# Patient Record
Sex: Female | Born: 1945 | Race: White | Hispanic: No
Health system: Southern US, Community
[De-identification: ages and names within clinical notes are randomized; demographics above are authoritative.]

## PROBLEM LIST (undated history)

## (undated) DIAGNOSIS — J9621 Acute and chronic respiratory failure with hypoxia: Secondary | ICD-10-CM

## (undated) DIAGNOSIS — J181 Lobar pneumonia, unspecified organism: Secondary | ICD-10-CM

## (undated) DIAGNOSIS — G894 Chronic pain syndrome: Secondary | ICD-10-CM

## (undated) DIAGNOSIS — J189 Pneumonia, unspecified organism: Secondary | ICD-10-CM | POA: Insufficient documentation

## (undated) DIAGNOSIS — C434 Malignant melanoma of scalp and neck: Secondary | ICD-10-CM

## (undated) DIAGNOSIS — G9341 Metabolic encephalopathy: Secondary | ICD-10-CM

## (undated) DIAGNOSIS — J449 Chronic obstructive pulmonary disease, unspecified: Secondary | ICD-10-CM

## (undated) DIAGNOSIS — K219 Gastro-esophageal reflux disease without esophagitis: Secondary | ICD-10-CM | POA: Insufficient documentation

## (undated) DIAGNOSIS — E039 Hypothyroidism, unspecified: Secondary | ICD-10-CM | POA: Insufficient documentation

---

## 2014-08-28 DIAGNOSIS — H2512 Age-related nuclear cataract, left eye: Secondary | ICD-10-CM | POA: Insufficient documentation

## 2015-05-04 DIAGNOSIS — J441 Chronic obstructive pulmonary disease with (acute) exacerbation: Secondary | ICD-10-CM | POA: Insufficient documentation

## 2015-07-02 DIAGNOSIS — J209 Acute bronchitis, unspecified: Secondary | ICD-10-CM | POA: Insufficient documentation

## 2015-07-15 DIAGNOSIS — D72829 Elevated white blood cell count, unspecified: Secondary | ICD-10-CM | POA: Insufficient documentation

## 2016-01-08 DIAGNOSIS — Z85820 Personal history of malignant melanoma of skin: Secondary | ICD-10-CM | POA: Insufficient documentation

## 2016-03-07 DIAGNOSIS — Z8659 Personal history of other mental and behavioral disorders: Secondary | ICD-10-CM | POA: Insufficient documentation

## 2016-03-07 DIAGNOSIS — G40909 Epilepsy, unspecified, not intractable, without status epilepticus: Secondary | ICD-10-CM | POA: Insufficient documentation

## 2016-03-08 DIAGNOSIS — B888 Other specified infestations: Secondary | ICD-10-CM | POA: Insufficient documentation

## 2017-07-03 DIAGNOSIS — Q2112 Patent foramen ovale: Secondary | ICD-10-CM | POA: Insufficient documentation

## 2017-07-03 DIAGNOSIS — I10 Essential (primary) hypertension: Secondary | ICD-10-CM | POA: Insufficient documentation

## 2017-11-15 DIAGNOSIS — J9601 Acute respiratory failure with hypoxia: Secondary | ICD-10-CM | POA: Insufficient documentation

## 2019-02-27 DIAGNOSIS — E1165 Type 2 diabetes mellitus with hyperglycemia: Secondary | ICD-10-CM | POA: Insufficient documentation

## 2019-07-23 DIAGNOSIS — J988 Other specified respiratory disorders: Secondary | ICD-10-CM | POA: Insufficient documentation

## 2019-08-07 ENCOUNTER — Other Ambulatory Visit (HOSPITAL_COMMUNITY): Payer: No Typology Code available for payment source

## 2019-08-07 ENCOUNTER — Inpatient Hospital Stay (HOSPITAL_COMMUNITY)
Admission: RE | Admit: 2019-08-07 | Discharge: 2019-08-20 | Disposition: A | Discharge status: Still In Hospital | Payer: No Typology Code available for payment source | Source: Other Acute Inpatient Hospital

## 2019-08-07 DIAGNOSIS — G9341 Metabolic encephalopathy: Secondary | ICD-10-CM | POA: Diagnosis present

## 2019-08-07 DIAGNOSIS — Z431 Encounter for attention to gastrostomy: Secondary | ICD-10-CM

## 2019-08-07 DIAGNOSIS — Z0189 Encounter for other specified special examinations: Secondary | ICD-10-CM

## 2019-08-07 DIAGNOSIS — J449 Chronic obstructive pulmonary disease, unspecified: Secondary | ICD-10-CM | POA: Diagnosis present

## 2019-08-07 DIAGNOSIS — J181 Lobar pneumonia, unspecified organism: Secondary | ICD-10-CM | POA: Diagnosis present

## 2019-08-07 DIAGNOSIS — J9809 Other diseases of bronchus, not elsewhere classified: Secondary | ICD-10-CM

## 2019-08-07 DIAGNOSIS — G894 Chronic pain syndrome: Secondary | ICD-10-CM | POA: Diagnosis present

## 2019-08-07 DIAGNOSIS — T17500A Unspecified foreign body in bronchus causing asphyxiation, initial encounter: Secondary | ICD-10-CM

## 2019-08-07 DIAGNOSIS — J189 Pneumonia, unspecified organism: Secondary | ICD-10-CM

## 2019-08-07 DIAGNOSIS — C434 Malignant melanoma of scalp and neck: Secondary | ICD-10-CM | POA: Diagnosis present

## 2019-08-07 DIAGNOSIS — J9690 Respiratory failure, unspecified, unspecified whether with hypoxia or hypercapnia: Secondary | ICD-10-CM

## 2019-08-07 DIAGNOSIS — K567 Ileus, unspecified: Secondary | ICD-10-CM

## 2019-08-07 DIAGNOSIS — J9621 Acute and chronic respiratory failure with hypoxia: Secondary | ICD-10-CM | POA: Diagnosis present

## 2019-08-07 HISTORY — DX: Lobar pneumonia, unspecified organism: J18.1

## 2019-08-07 HISTORY — DX: Malignant melanoma of scalp and neck: C43.4

## 2019-08-07 HISTORY — DX: Acute and chronic respiratory failure with hypoxia: J96.21

## 2019-08-07 HISTORY — DX: Chronic obstructive pulmonary disease, unspecified: J44.9

## 2019-08-07 HISTORY — DX: Chronic pain syndrome: G89.4

## 2019-08-07 HISTORY — DX: Metabolic encephalopathy: G93.41

## 2019-08-08 ENCOUNTER — Other Ambulatory Visit (HOSPITAL_COMMUNITY): Payer: No Typology Code available for payment source

## 2019-08-08 ENCOUNTER — Encounter: Payer: Self-pay | Admitting: Internal Medicine

## 2019-08-08 DIAGNOSIS — J9621 Acute and chronic respiratory failure with hypoxia: Secondary | ICD-10-CM | POA: Diagnosis not present

## 2019-08-08 DIAGNOSIS — G894 Chronic pain syndrome: Secondary | ICD-10-CM | POA: Diagnosis present

## 2019-08-08 DIAGNOSIS — C434 Malignant melanoma of scalp and neck: Secondary | ICD-10-CM | POA: Diagnosis present

## 2019-08-08 DIAGNOSIS — G9341 Metabolic encephalopathy: Secondary | ICD-10-CM | POA: Diagnosis not present

## 2019-08-08 DIAGNOSIS — J449 Chronic obstructive pulmonary disease, unspecified: Secondary | ICD-10-CM | POA: Diagnosis not present

## 2019-08-08 DIAGNOSIS — J181 Lobar pneumonia, unspecified organism: Secondary | ICD-10-CM | POA: Diagnosis present

## 2019-08-08 LAB — COMPREHENSIVE METABOLIC PANEL
ALT: 13 U/L (ref 0–44)
AST: 14 U/L — ABNORMAL LOW (ref 15–41)
Albumin: 2.8 g/dL — ABNORMAL LOW (ref 3.5–5.0)
Alkaline Phosphatase: 49 U/L (ref 38–126)
Anion gap: 12 (ref 5–15)
BUN: 8 mg/dL (ref 8–23)
CO2: 26 mmol/L (ref 22–32)
Calcium: 8.5 mg/dL — ABNORMAL LOW (ref 8.9–10.3)
Chloride: 106 mmol/L (ref 98–111)
Creatinine, Ser: 0.69 mg/dL (ref 0.44–1.00)
GFR calc Af Amer: >60 mL/min (ref >60)
GFR calc non Af Amer: >60 mL/min (ref >60)
Glucose, Bld: 127 mg/dL — ABNORMAL HIGH (ref 70–99)
Potassium: 3.5 mmol/L (ref 3.5–5.1)
Sodium: 144 mmol/L (ref 135–145)
Total Bilirubin: 0.6 mg/dL (ref 0.3–1.2)
Total Protein: 6.1 g/dL — ABNORMAL LOW (ref 6.5–8.1)

## 2019-08-08 LAB — CBC WITH DIFFERENTIAL/PLATELET
Abs Immature Granulocytes: 0.05 10*3/uL (ref 0.00–0.07)
Basophils Absolute: 0 10*3/uL (ref 0.0–0.1)
Basophils Relative: 1 %
Eosinophils Absolute: 0.2 10*3/uL (ref 0.0–0.5)
Eosinophils Relative: 4 %
HCT: 33.4 % — ABNORMAL LOW (ref 36.0–46.0)
Hemoglobin: 10.1 g/dL — ABNORMAL LOW (ref 12.0–15.0)
Immature Granulocytes: 1 %
Lymphocytes Relative: 20 %
Lymphs Abs: 1.2 10*3/uL (ref 0.7–4.0)
MCH: 28.2 pg (ref 26.0–34.0)
MCHC: 30.2 g/dL (ref 30.0–36.0)
MCV: 93.3 fL (ref 80.0–100.0)
Monocytes Absolute: 0.4 10*3/uL (ref 0.1–1.0)
Monocytes Relative: 7 %
Neutro Abs: 4 10*3/uL (ref 1.7–7.7)
Neutrophils Relative %: 67 %
Platelets: 221 10*3/uL (ref 150–400)
RBC: 3.58 MIL/uL — ABNORMAL LOW (ref 3.87–5.11)
RDW: 17.9 % — ABNORMAL HIGH (ref 11.5–15.5)
WBC: 5.8 10*3/uL (ref 4.0–10.5)
nRBC: 0 % (ref 0.0–0.2)

## 2019-08-08 LAB — T4, FREE: Free T4: 1.02 ng/dL (ref 0.61–1.12)

## 2019-08-08 LAB — PHOSPHORUS: Phosphorus: 3.9 mg/dL (ref 2.5–4.6)

## 2019-08-08 LAB — TSH: TSH: 3.293 u[IU]/mL (ref 0.350–4.500)

## 2019-08-08 LAB — PROTIME-INR
INR: 0.9 (ref 0.8–1.2)
Prothrombin Time: 12.4 seconds (ref 11.4–15.2)

## 2019-08-08 LAB — MAGNESIUM: Magnesium: 2 mg/dL (ref 1.7–2.4)

## 2019-08-08 MED ORDER — CARBOXYMETHYLCELLULOSE SOD PF 0.5 % OP SOLN
2.00 | OPHTHALMIC | Status: DC
Start: 2019-08-07 — End: 2019-08-08

## 2019-08-08 MED ORDER — FAMOTIDINE 20 MG PO TABS
20.00 | ORAL_TABLET | ORAL | Status: DC
Start: 2019-08-08 — End: 2019-08-08

## 2019-08-08 MED ORDER — HEPARIN SODIUM (PORCINE) 5000 UNIT/ML IJ SOLN
7500.00 | INTRAMUSCULAR | Status: DC
Start: 2019-08-07 — End: 2019-08-08

## 2019-08-08 MED ORDER — FUROSEMIDE 10 MG/ML IJ SOLN
20.00 | INTRAMUSCULAR | Status: DC
Start: 2019-08-08 — End: 2019-08-08

## 2019-08-08 MED ORDER — BUSPIRONE HCL 15 MG PO TABS
30.00 | ORAL_TABLET | ORAL | Status: DC
Start: 2019-08-07 — End: 2019-08-08

## 2019-08-08 MED ORDER — IPRATROPIUM-ALBUTEROL 0.5-2.5 (3) MG/3ML IN SOLN
3.00 | RESPIRATORY_TRACT | Status: DC
Start: 2019-08-07 — End: 2019-08-08

## 2019-08-08 MED ORDER — INSULIN ASPART 100 UNIT/ML FLEXPEN
0.00 | PEN_INJECTOR | SUBCUTANEOUS | Status: DC
Start: 2019-08-07 — End: 2019-08-08

## 2019-08-08 MED ORDER — DULOXETINE HCL 60 MG PO CPEP
60.00 | ORAL_CAPSULE | ORAL | Status: DC
Start: 2019-08-08 — End: 2019-08-08

## 2019-08-08 MED ORDER — GENERIC EXTERNAL MEDICATION
12.00 | Status: DC
Start: ? — End: 2019-08-08

## 2019-08-08 MED ORDER — GENERIC EXTERNAL MEDICATION
Status: DC
Start: ? — End: 2019-08-08

## 2019-08-08 MED ORDER — ROPINIROLE HCL 2 MG PO TABS
10.00 | ORAL_TABLET | ORAL | Status: DC
Start: 2019-08-07 — End: 2019-08-08

## 2019-08-08 MED ORDER — ATROPINE SULFATE 0.5 MG/5ML IJ SOSY
0.50 | PREFILLED_SYRINGE | INTRAMUSCULAR | Status: DC
Start: ? — End: 2019-08-08

## 2019-08-08 MED ORDER — LORATADINE 10 MG PO TABS
10.00 | ORAL_TABLET | ORAL | Status: DC
Start: 2019-08-08 — End: 2019-08-08

## 2019-08-08 MED ORDER — EPINEPHRINE 1 MG/10ML IJ SOSY
1.00 | PREFILLED_SYRINGE | INTRAMUSCULAR | Status: DC
Start: ? — End: 2019-08-08

## 2019-08-08 MED ORDER — CHLORHEXIDINE GLUCONATE 0.12 % MT SOLN
15.00 | OROMUCOSAL | Status: DC
Start: 2019-08-07 — End: 2019-08-08

## 2019-08-08 MED ORDER — NALOXONE HCL 0.4 MG/ML IJ SOLN
0.04 | INTRAMUSCULAR | Status: DC
Start: ? — End: 2019-08-08

## 2019-08-08 MED ORDER — AMLODIPINE BESYLATE 5 MG PO TABS
5.00 | ORAL_TABLET | ORAL | Status: DC
Start: 2019-08-08 — End: 2019-08-08

## 2019-08-08 MED ORDER — LORAZEPAM 2 MG/ML IJ SOLN
2.00 | INTRAMUSCULAR | Status: DC
Start: ? — End: 2019-08-08

## 2019-08-08 MED ORDER — L-LYSINE EX OINT
TOPICAL_OINTMENT | CUTANEOUS | Status: DC
Start: ? — End: 2019-08-08

## 2019-08-08 MED ORDER — INSULIN GLARGINE 100 UNIT/ML SOLOSTAR PEN
30.00 | PEN_INJECTOR | SUBCUTANEOUS | Status: DC
Start: 2019-08-07 — End: 2019-08-08

## 2019-08-08 MED ORDER — BISACODYL 10 MG RE SUPP
10.00 | RECTAL | Status: DC
Start: ? — End: 2019-08-08

## 2019-08-08 MED ORDER — MAGNESIUM SULFATE 4 GM/100ML IV SOLN
4.00 | INTRAVENOUS | Status: DC
Start: ? — End: 2019-08-08

## 2019-08-08 MED ORDER — GENERIC EXTERNAL MEDICATION
5.00 | Status: DC
Start: ? — End: 2019-08-08

## 2019-08-08 MED ORDER — GENERIC EXTERNAL MEDICATION
1.00 | Status: DC
Start: ? — End: 2019-08-08

## 2019-08-08 MED ORDER — GENERIC EXTERNAL MEDICATION
200.00 | Status: DC
Start: 2019-08-08 — End: 2019-08-08

## 2019-08-08 MED ORDER — ONDANSETRON HCL 4 MG/2ML IJ SOLN
4.00 | INTRAMUSCULAR | Status: DC
Start: ? — End: 2019-08-08

## 2019-08-08 MED ORDER — BIAFINE EX EMUL
CUTANEOUS | Status: DC
Start: ? — End: 2019-08-08

## 2019-08-08 MED ORDER — SODIUM CHLORIDE 0.9 % IV SOLN
INTRAVENOUS | Status: DC
Start: ? — End: 2019-08-08

## 2019-08-08 MED ORDER — PROSOURCE TF PO LIQD
90.00 | ORAL | Status: DC
Start: 2019-08-07 — End: 2019-08-08

## 2019-08-08 MED ORDER — BACLOFEN 10 MG PO TABS
10.00 | ORAL_TABLET | ORAL | Status: DC
Start: ? — End: 2019-08-08

## 2019-08-08 MED ORDER — GABAPENTIN 300 MG PO CAPS
600.00 | ORAL_CAPSULE | ORAL | Status: DC
Start: 2019-08-07 — End: 2019-08-08

## 2019-08-08 MED ORDER — ACETAMINOPHEN 325 MG PO TABS
650.00 | ORAL_TABLET | ORAL | Status: DC
Start: ? — End: 2019-08-08

## 2019-08-08 MED ORDER — CLONAZEPAM 1 MG PO TABS
1.00 | ORAL_TABLET | ORAL | Status: DC
Start: 2019-08-07 — End: 2019-08-08

## 2019-08-08 MED ORDER — LACTATED RINGERS IV SOLN
500.00 | INTRAVENOUS | Status: DC
Start: ? — End: 2019-08-08

## 2019-08-08 MED ORDER — GENERIC EXTERNAL MEDICATION
4000.00 | Status: DC
Start: ? — End: 2019-08-08

## 2019-08-08 MED ORDER — CLOPIDOGREL BISULFATE 75 MG PO TABS
75.00 | ORAL_TABLET | ORAL | Status: DC
Start: 2019-08-08 — End: 2019-08-08

## 2019-08-08 MED ORDER — MELATONIN 3 MG PO TABS
3.00 | ORAL_TABLET | ORAL | Status: DC
Start: 2019-08-07 — End: 2019-08-08

## 2019-08-08 MED ORDER — DEXTROSE 50 % IV SOLN
25.00 | INTRAVENOUS | Status: DC
Start: ? — End: 2019-08-08

## 2019-08-08 MED ORDER — IPRATROPIUM-ALBUTEROL 0.5-2.5 (3) MG/3ML IN SOLN
3.00 | RESPIRATORY_TRACT | Status: DC
Start: ? — End: 2019-08-08

## 2019-08-08 MED ORDER — METOCLOPRAMIDE HCL 5 MG/ML IJ SOLN
10.00 | INTRAMUSCULAR | Status: DC
Start: 2019-08-07 — End: 2019-08-08

## 2019-08-08 MED ORDER — SENNOSIDES-DOCUSATE SODIUM 8.6-50 MG PO TABS
2.00 | ORAL_TABLET | ORAL | Status: DC
Start: 2019-08-07 — End: 2019-08-08

## 2019-08-08 NOTE — Consult Note (Signed)
Pulmonary Critical Care Medicine [place]  PULMONARY SERVICE  Date of Service: 08/08/2019  PULMONARY CRITICAL CARE Shakarra Dunkle  [id number]  DOB: [birth year]   DOA: 08/07/2019  Referring Physician: Merton Border, MD  HPI: Latoya Harris is a 73 y.o. female seen for follow up of Acute on Chronic Respiratory Failure.  The history is somewhat scant however patient has a history of malignant melanoma who presented with swelling of the neck.  Patient underwent a CT scan because of respiratory distress and patient was intubated.  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  Hospital course at the other facility apparently had an ENT consultation for tracheostomy which was done the following day.  The patient continued on full support on the ventilator requiring about 40% FiO2 with a PEEP of 8.  Sedation was eventually weaned off patient was attempted on weans however did not tolerate.  Was attempted on pressure support 18/8 40% and has the Dex going at that time.  Patient apparently has a lot of issues with agitation and anxiety.  She is transferred to our facility now for further management and weaning attempts.  Review of Systems:  ROS performed and is unremarkable other than noted above.   Past Medical History:  Diagnosis Date  . Arthritis of spine  . Asthma  . Bell palsy  . Benign essential hypertension  . Calculus of kidney  . Cancer (HCC)  Left shoulder  . Cellulitis of arm, left 09/19/1898  . Chronic obstructive lung disease ([place])  . Chronic pain syndrome  . Degeneration of thoracic intervertebral disc  . Degeneration of thoracolumbar intervertebral disc  . Enlarged heart  . Facial paralysis/Bells palsy  . Gastroesophageal reflux disease  . Hematemesis 09/19/2009  . Hematemesis  Onset: 2011- Neg EGD 2011  . Hematochezia 09/19/1898  . Hematochezia  . History of primary malignant neoplasm of skin 09/19/1898  Melanoma  . Hypocalcemia 09/19/1898   . Hypokalemia 09/19/1898  . Hypothyroidism  . Insomnia  . Lymphedema  . Migraine  . Mixed stress and urge urinary incontinence  . Pneumonia 09/19/1898  . Postmastectomy lymphedema syndrome  . PUD (peptic ulcer disease)  . Sleep stage dysfunction  . Spondylosis without myelopathy  . Spontaneous ecchymosis   Past Surgical History:  Procedure Laterality Date  . EGD 2005  gastric polyps  . HX APPENDECTOMY  . HX BACK SURGERY  X 3  . HX CATARACT REMOVAL  . 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 HX CHOLECYSTECTOMY  . HX HYSTERECTOMY  . HX KNEE REPLACMENT Bilateral 2010  . HX LYMPHADENECTOMY  to left arm  . HX NECK SURGERY  X 2  . HX SPINAL SURGERY 1999  . PR XCAPSL CTRC RMVL INSJ IO LENS PROSTH W/O ECP Left 08/28/2014  CATARACT INTRAOCULAR LENS EXTRACTION performed by Mallie Mussel, MD at [place]  . PR XCAPSL CTRC RMVL INSJ IO LENS PROSTH W/O ECP Right 09/04/2014  CATARACT INTRAOCULAR LENS EXTRACTION performed by Mallie Mussel, MD at [place] History  Problem Relation Name Age of Onset  . Cancer Sister  . Respiratory Disease Brother  . Respiratory Disease Sister  . Asthma Sister  . Unknown Brother  . Hypertension Mother  . Heart Disease Mother  . Unknown Maternal Grandmother  . Unknown Maternal Grandfather  . Unknown Paternal Grandmother  . Unknown Paternal Grandfather  . Healthy Son  . Diabetes Neg Hx  Social History   Socioeconomic History  . Marital status: Widowed  Spouse name: Not on file  . Number of children: 1  . Years of education: 6  . Highest education level: Not on file  Occupational History  . Occupation: Oncologist  Social Needs  . Financial resource strain: Not on file  . Food insecurity  Worry: Not on file  Inability: Not on file  . Transportation needs  Medical: Not on file  Non-medical: Not on file  Tobacco Use  . Smoking status: Never Smoker  . Smokeless tobacco: Current User   Types: Snuff  Substance and Sexual Activity  . Alcohol use: No  . Drug use: No    Medications: Reviewed on Rounds  Physical Exam:  Vitals: Temperature 98.0 pulse 71 respiratory 22 blood pressure 154/59 saturations 97%  Ventilator Settings mode ventilation assist control FiO2 28% tidal volume was 450 PEEP 5  . General: Comfortable at this time . Eyes: Grossly normal lids, irises & conjunctiva . ENT: grossly tongue is normal . Neck: no obvious mass . Cardiovascular: S1-S2 normal no gallop or rub was noted . Respiratory: Coarse breath sounds with a few rhonchi . Abdomen: Soft and nontender at this time . Skin: no rash seen on limited exam . Musculoskeletal: not rigid . Psychiatric:unable to assess . Neurologic: no seizure no involuntary movements         Labs on Admission:  Basic Metabolic Panel: Recent Labs  Lab 08/08/19 0708  NA 144  K 3.5  CL 106  CO2 26  GLUCOSE 127*  BUN 8  CREATININE 0.69  CALCIUM 8.5*  MG 2.0  PHOS 3.9    No results for input(s): PHART, PCO2ART, PO2ART, HCO3, O2SAT in the last 168 hours.  Liver Function Tests: Recent Labs  Lab 08/08/19 0708  AST 14*  ALT 13  ALKPHOS 49  BILITOT 0.6  PROT 6.1*  ALBUMIN 2.8*   No results for input(s): LIPASE, AMYLASE in the last 168 hours. No results for input(s): AMMONIA in the last 168 hours.  CBC: Recent Labs  Lab 08/08/19 0708  WBC 5.8  NEUTROABS 4.0  HGB 10.1*  HCT 33.4*  MCV 93.3  PLT 221    Cardiac Enzymes: No results for input(s): CKTOTAL, CKMB, CKMBINDEX, TROPONINI in the last 168 hours.  BNP (last 3 results) No results for input(s): BNP in the last 8760 hours.  ProBNP (last 3 results) No results for input(s): PROBNP in the last 8760 hours.   Radiological Exams on Admission: Dg Abd 1 View  Result Date: 08/07/2019 CLINICAL DATA:  Feeding tube placement EXAM: ABDOMEN - 1 VIEW COMPARISON:  None. FINDINGS: The tip of the feeding tube appears to be in the descending  duodenum based on curvature. No dilated bowel noted. IMPRESSION: 1. Feeding tube tip: Descending duodenum. Electronically Signed   By: Van Clines M.D.   On: 08/07/2019 18:36   Dg Chest Port 1 View  Result Date: 08/08/2019 CLINICAL DATA:  Hypoxia EXAM: PORTABLE CHEST 1 VIEW COMPARISON:  None. FINDINGS: Tracheostomy catheter tip is 8.0 cm above the carina. Feeding tube tip is below the diaphragm. No pneumothorax. There is a small left pleural effusion with mild left base atelectasis. The lungs elsewhere are clear. Heart is mildly enlarged with pulmonary vascularity normal. There is aortic atherosclerosis. No adenopathy. There are surgical clips in the left axillary region. IMPRESSION: Tube positions as described without evident pneumothorax. Small left pleural effusion with left base atelectasis. Lungs elsewhere clear. Heart mildly enlarged.  Aortic Atherosclerosis (ICD10-I70.0). Electronically Signed   By: Lowella Grip III M.D.   On: 08/08/2019 08:01    Assessment/Plan Active Problems:   Acute on chronic respiratory failure with hypoxia (HCC)   COPD, severe (HCC)   Lobar pneumonia, unspecified organism (HCC)   Acute metabolic encephalopathy   Malignant melanoma of head and neck (HCC)   Chronic pain syndrome   1. Acute on chronic respiratory failure with hypoxia patient currently is on full support on assist control mode requiring 28% FiO2.  The patient had a tracheostomy done because of the neck mass so I do not anticipate that she will be decannulated however we will see how things go. 2. COPD patient apparently has history of COPD we will continue with the treatment per primary care team as needed nebulizers. 3. Lower lobe pneumonia is the patient was diagnosed at the other facility with a pneumonia.  Current chest x-ray reveals small pleural effusions with some basilar atelectasis we will continue to monitor closely. 4. Metabolic encephalopathy supportive care patient did have  issues with agitation at the other facility. 5. Chronic pain control as necessary 6. Malignant melanoma status post tracheostomy  I have personally seen and evaluated the patient, evaluated laboratory and imaging results, formulated the assessment and plan and placed orders. The Patient requires high complexity decision making for assessment and support.  Case was discussed on Rounds with the Respiratory Therapy Staff Time Spent 51minutes  Allyne Gee, MD [facility] Pulmonary Critical Care Medicine Sleep Medicine

## 2019-08-09 DIAGNOSIS — J9621 Acute and chronic respiratory failure with hypoxia: Secondary | ICD-10-CM | POA: Diagnosis not present

## 2019-08-09 DIAGNOSIS — J449 Chronic obstructive pulmonary disease, unspecified: Secondary | ICD-10-CM | POA: Diagnosis not present

## 2019-08-09 DIAGNOSIS — G894 Chronic pain syndrome: Secondary | ICD-10-CM | POA: Diagnosis not present

## 2019-08-09 DIAGNOSIS — J181 Lobar pneumonia, unspecified organism: Secondary | ICD-10-CM

## 2019-08-09 DIAGNOSIS — G9341 Metabolic encephalopathy: Secondary | ICD-10-CM | POA: Diagnosis not present

## 2019-08-09 NOTE — Progress Notes (Addendum)
Pulmonary Critical Care Medicine [place]   PULMONARY CRITICAL CARE SERVICE  PROGRESS NOTE  Date of Service: 08/09/2019  Latoya Harris  [id number]  DOB: September 22, 1945   DOA: 08/07/2019  Referring Physician: Merton Border, MD  HPI: Latoya Harris is a 73 y.o. female seen for follow up of Acute on Chronic Respiratory Failure.  Patient has a 12-hour goal today on pressure support currently satting well with fever distress.  Medications: Reviewed on Rounds  Physical Exam:  Vitals: Pulse 62 respirations 22 BP 147/70 O2 sat 98% temp 96.9  Ventilator Settings pressure support 12/5 FiO2 28%  . General: Comfortable at this time . Eyes: Grossly normal lids, irises & conjunctiva . ENT: grossly tongue is normal . Neck: no obvious mass . Cardiovascular: S1 S2 normal no gallop . Respiratory: No rales rhonchi noted . Abdomen: soft . Skin: no rash seen on limited exam . Musculoskeletal: not rigid . Psychiatric:unable to assess . Neurologic: no seizure no involuntary movements         Lab Data:   Basic Metabolic Panel: Recent Labs  Lab 08/08/19 0708  NA 144  K 3.5  CL 106  CO2 26  GLUCOSE 127*  BUN 8  CREATININE 0.69  CALCIUM 8.5*  MG 2.0  PHOS 3.9    ABG: No results for input(s): PHART, PCO2ART, PO2ART, HCO3, O2SAT in the last 168 hours.  Liver Function Tests: Recent Labs  Lab 08/08/19 0708  AST 14*  ALT 13  ALKPHOS 49  BILITOT 0.6  PROT 6.1*  ALBUMIN 2.8*   No results for input(s): LIPASE, AMYLASE in the last 168 hours. No results for input(s): AMMONIA in the last 168 hours.  CBC: Recent Labs  Lab 08/08/19 0708  WBC 5.8  NEUTROABS 4.0  HGB 10.1*  HCT 33.4*  MCV 93.3  PLT 221    Cardiac Enzymes: No results for input(s): CKTOTAL, CKMB, CKMBINDEX, TROPONINI in the last 168 hours.  BNP (last 3 results) No results for input(s): BNP in the last 8760 hours.  ProBNP (last 3 results) No results for input(s): PROBNP in the last  8760 hours.  Radiological Exams: Dg Abd 1 View  Result Date: 08/07/2019 CLINICAL DATA:  Feeding tube placement EXAM: ABDOMEN - 1 VIEW COMPARISON:  None. FINDINGS: The tip of the feeding tube appears to be in the descending duodenum based on curvature. No dilated bowel noted. IMPRESSION: 1. Feeding tube tip: Descending duodenum. Electronically Signed   By: Van Clines M.D.   On: 08/07/2019 18:36   Dg Chest Port 1 View  Result Date: 08/08/2019 CLINICAL DATA:  Hypoxia EXAM: PORTABLE CHEST 1 VIEW COMPARISON:  None. FINDINGS: Tracheostomy catheter tip is 8.0 cm above the carina. Feeding tube tip is below the diaphragm. No pneumothorax. There is a small left pleural effusion with mild left base atelectasis. The lungs elsewhere are clear. Heart is mildly enlarged with pulmonary vascularity normal. There is aortic atherosclerosis. No adenopathy. There are surgical clips in the left axillary region. IMPRESSION: Tube positions as described without evident pneumothorax. Small left pleural effusion with left base atelectasis. Lungs elsewhere clear. Heart mildly enlarged. Aortic Atherosclerosis (ICD10-I70.0). Electronically Signed   By: Lowella Grip III M.D.   On: 08/08/2019 08:01    Assessment/Plan Active Problems:   Acute on chronic respiratory failure with hypoxia (HCC)   COPD, severe (HCC)   Lobar pneumonia, unspecified organism (HCC)   Acute metabolic encephalopathy   Malignant melanoma of head and neck (HCC)   Chronic pain syndrome  1. Acute on chronic respiratory failure with hypoxia patient will continue to wean on pressure support as per protocol.  Has all 12 hours today.  We will continue supportive measures and pulmonary toilet. 2. COPD patient apparently has history of COPD patient currently being treated by primary care team continue nebulizers as needed. 3. Lower lobe pneumonia is the patient was diagnosed at the other facility continue to monitor. 4. Metabolic encephalopathy  we will continue monitor closely. 5. Chronic pain control as necessary 6. Malignant melanoma status post tracheostomy   I have personally seen and evaluated the patient, evaluated laboratory and imaging results, formulated the assessment and plan and placed orders. The Patient requires high complexity decision making for assessment and support.  Case was discussed on Rounds with the Respiratory Therapy Staff  Allyne Gee, MD [facility] Pulmonary Critical Care Medicine Sleep Medicine

## 2019-08-10 ENCOUNTER — Other Ambulatory Visit (HOSPITAL_COMMUNITY): Payer: No Typology Code available for payment source

## 2019-08-10 DIAGNOSIS — G894 Chronic pain syndrome: Secondary | ICD-10-CM

## 2019-08-10 DIAGNOSIS — J449 Chronic obstructive pulmonary disease, unspecified: Secondary | ICD-10-CM | POA: Diagnosis not present

## 2019-08-10 DIAGNOSIS — J181 Lobar pneumonia, unspecified organism: Secondary | ICD-10-CM

## 2019-08-10 DIAGNOSIS — G9341 Metabolic encephalopathy: Secondary | ICD-10-CM

## 2019-08-10 DIAGNOSIS — J9621 Acute and chronic respiratory failure with hypoxia: Secondary | ICD-10-CM

## 2019-08-10 DIAGNOSIS — C434 Malignant melanoma of scalp and neck: Secondary | ICD-10-CM

## 2019-08-10 LAB — BASIC METABOLIC PANEL
Anion gap: 11 (ref 5–15)
BUN: 12 mg/dL (ref 8–23)
CO2: 29 mmol/L (ref 22–32)
Calcium: 8.9 mg/dL (ref 8.9–10.3)
Chloride: 101 mmol/L (ref 98–111)
Creatinine, Ser: 0.86 mg/dL (ref 0.44–1.00)
GFR calc Af Amer: >60 mL/min (ref >60)
GFR calc non Af Amer: >60 mL/min (ref >60)
Glucose, Bld: 144 mg/dL — ABNORMAL HIGH (ref 70–99)
Potassium: 3.5 mmol/L (ref 3.5–5.1)
Sodium: 141 mmol/L (ref 135–145)

## 2019-08-10 LAB — MAGNESIUM: Magnesium: 2.2 mg/dL (ref 1.7–2.4)

## 2019-08-10 MED ORDER — GENERIC EXTERNAL MEDICATION
Status: DC
Start: ? — End: 2019-08-10

## 2019-08-10 NOTE — Progress Notes (Addendum)
Pulmonary Critical Care Medicine [place]   PULMONARY CRITICAL CARE SERVICE  PROGRESS NOTE  Date of Service: 08/10/2019  Latoya Harris  [id number]  DOB: 1946/09/12   DOA: 08/07/2019  Referring Physician: Merton Border, MD  HPI: Latoya Harris is a 73 y.o. female seen for follow up of Acute on Chronic Respiratory Failure.  Patient remains on pressure at this time for 16-hour goal today currently satting well no distress.  Medications: Reviewed on Rounds  Physical Exam:  Vitals: Pulse 70 respirations 25 BP 120/32 O2 sat 98% temp 97.0  Ventilator Settings pressure support 12/5 FiO2 20%  . General: Comfortable at this time . Eyes: Grossly normal lids, irises & conjunctiva . ENT: grossly tongue is normal . Neck: no obvious mass . Cardiovascular: S1 S2 normal no gallop . Respiratory: No rales rhonchi noted . Abdomen: soft . Skin: no rash seen on limited exam . Musculoskeletal: not rigid . Psychiatric:unable to assess . Neurologic: no seizure no involuntary movements         Lab Data:   Basic Metabolic Panel: Recent Labs  Lab 08/08/19 0708 08/10/19 0539  NA 144 141  K 3.5 3.5  CL 106 101  CO2 26 29  GLUCOSE 127* 144*  BUN 8 12  CREATININE 0.69 0.86  CALCIUM 8.5* 8.9  MG 2.0 2.2  PHOS 3.9  --     ABG: No results for input(s): PHART, PCO2ART, PO2ART, HCO3, O2SAT in the last 168 hours.  Liver Function Tests: Recent Labs  Lab 08/08/19 0708  AST 14*  ALT 13  ALKPHOS 49  BILITOT 0.6  PROT 6.1*  ALBUMIN 2.8*   No results for input(s): LIPASE, AMYLASE in the last 168 hours. No results for input(s): AMMONIA in the last 168 hours.  CBC: Recent Labs  Lab 08/08/19 0708  WBC 5.8  NEUTROABS 4.0  HGB 10.1*  HCT 33.4*  MCV 93.3  PLT 221    Cardiac Enzymes: No results for input(s): CKTOTAL, CKMB, CKMBINDEX, TROPONINI in the last 168 hours.  BNP (last 3 results) No results for input(s): BNP in the last 8760 hours.  ProBNP  (last 3 results) No results for input(s): PROBNP in the last 8760 hours.  Radiological Exams: Dg Abd 1 View  Result Date: 08/10/2019 CLINICAL DATA:  Enteric tube placement EXAM: ABDOMEN - 1 VIEW COMPARISON:  08/07/2019 abdominal radiograph FINDINGS: Enteric tube terminates in the transverse duodenum. No disproportionately dilated small bowel loops. Moderate colonic gas. No evidence of pneumatosis or pneumoperitoneum. Surgical clips are seen in the right upper quadrant of the abdomen. No radiopaque nephrolithiasis. IMPRESSION: 1. Enteric tube terminates in post pyloric position with tip in the transverse duodenum. 2. Nonobstructive bowel gas pattern. 3. Moderate colonic gas. Electronically Signed   By: Ilona Sorrel M.D.   On: 08/10/2019 13:04    Assessment/Plan Active Problems:   Acute on chronic respiratory failure with hypoxia (HCC)   COPD, severe (HCC)   Lobar pneumonia, unspecified organism (HCC)   Acute metabolic encephalopathy   Malignant melanoma of head and neck (HCC)   Chronic pain syndrome   1. Acute on chronic respiratory failure with hypoxia  patient: 16 hours on first today currently satting well we will continue to wean per protocol as patient tolerate.  Continue aggressive pulmonary toilet supportive measures. 2. COPD patient apparently has history of COPD patient currently being treated by primary care team continue nebulizers as needed. 3. Lower lobe pneumonia is the patient was diagnosed at the other facility continue  to monitor. 4. Metabolic encephalopathy we will continue monitor closely. 5. Chronic pain control as necessary 6. Malignant melanoma status post tracheostomy   I have personally seen and evaluated the patient, evaluated laboratory and imaging results, formulated the assessment and plan and placed orders. The Patient requires high complexity decision making for assessment and support.  Case was discussed on Rounds with the Respiratory Therapy  Staff  Allyne Gee, MD [facility] Pulmonary Critical Care Medicine Sleep Medicine

## 2019-08-11 DIAGNOSIS — J9621 Acute and chronic respiratory failure with hypoxia: Secondary | ICD-10-CM | POA: Diagnosis not present

## 2019-08-11 DIAGNOSIS — G9341 Metabolic encephalopathy: Secondary | ICD-10-CM | POA: Diagnosis not present

## 2019-08-11 DIAGNOSIS — G894 Chronic pain syndrome: Secondary | ICD-10-CM | POA: Diagnosis not present

## 2019-08-11 DIAGNOSIS — J449 Chronic obstructive pulmonary disease, unspecified: Secondary | ICD-10-CM | POA: Diagnosis not present

## 2019-08-11 LAB — BASIC METABOLIC PANEL
Anion gap: 12 (ref 5–15)
BUN: 13 mg/dL (ref 8–23)
CO2: 28 mmol/L (ref 22–32)
Calcium: 9.1 mg/dL (ref 8.9–10.3)
Chloride: 102 mmol/L (ref 98–111)
Creatinine, Ser: 0.93 mg/dL (ref 0.44–1.00)
GFR calc Af Amer: >60 mL/min (ref >60)
GFR calc non Af Amer: >60 mL/min (ref >60)
Glucose, Bld: 149 mg/dL — ABNORMAL HIGH (ref 70–99)
Potassium: 4.5 mmol/L (ref 3.5–5.1)
Sodium: 142 mmol/L (ref 135–145)

## 2019-08-11 NOTE — Progress Notes (Signed)
Pulmonary Critical Care Medicine [place]   PULMONARY CRITICAL CARE SERVICE  PROGRESS NOTE  Date of Service: 08/11/2019  Latoya Harris  [id number]  DOB: [birth year]   DOA: 08/07/2019  Referring Physician: Merton Border, MD  HPI: Latoya Harris is a 73 y.o. female seen for follow up of Acute on Chronic Respiratory Failure.  Patient is on T collar weaning goal is for about 2 hours  Medications: Reviewed on Rounds  Physical Exam:  Vitals: Temperature 97.5 pulse 74 respiratory 27 blood pressure 133/79 saturation 98%  Ventilator Settings off the ventilator on T collar currently on 28% FiO2  . General: Comfortable at this time . Eyes: Grossly normal lids, irises & conjunctiva . ENT: grossly tongue is normal . Neck: no obvious mass . Cardiovascular: S1 S2 normal no gallop . Respiratory: No rhonchi no rales are noted at this time. . Abdomen: soft . Skin: no rash seen on limited exam . Musculoskeletal: not rigid . Psychiatric:unable to assess . Neurologic: no seizure no involuntary movements         Lab Data:   Basic Metabolic Panel: Recent Labs  Lab 08/08/19 0708 08/10/19 0539  NA 144 141  K 3.5 3.5  CL 106 101  CO2 26 29  GLUCOSE 127* 144*  BUN 8 12  CREATININE 0.69 0.86  CALCIUM 8.5* 8.9  MG 2.0 2.2  PHOS 3.9  --     ABG: No results for input(s): PHART, PCO2ART, PO2ART, HCO3, O2SAT in the last 168 hours.  Liver Function Tests: Recent Labs  Lab 08/08/19 0708  AST 14*  ALT 13  ALKPHOS 49  BILITOT 0.6  PROT 6.1*  ALBUMIN 2.8*   No results for input(s): LIPASE, AMYLASE in the last 168 hours. No results for input(s): AMMONIA in the last 168 hours.  CBC: Recent Labs  Lab 08/08/19 0708  WBC 5.8  NEUTROABS 4.0  HGB 10.1*  HCT 33.4*  MCV 93.3  PLT 221    Cardiac Enzymes: No results for input(s): CKTOTAL, CKMB, CKMBINDEX, TROPONINI in the last 168 hours.  BNP (last 3 results) No results for input(s): BNP in the last  8760 hours.  ProBNP (last 3 results) No results for input(s): PROBNP in the last 8760 hours.  Radiological Exams: Dg Abd 1 View  Result Date: 08/10/2019 CLINICAL DATA:  Enteric tube placement EXAM: ABDOMEN - 1 VIEW COMPARISON:  08/07/2019 abdominal radiograph FINDINGS: Enteric tube terminates in the transverse duodenum. No disproportionately dilated small bowel loops. Moderate colonic gas. No evidence of pneumatosis or pneumoperitoneum. Surgical clips are seen in the right upper quadrant of the abdomen. No radiopaque nephrolithiasis. IMPRESSION: 1. Enteric tube terminates in post pyloric position with tip in the transverse duodenum. 2. Nonobstructive bowel gas pattern. 3. Moderate colonic gas. Electronically Signed   By: Ilona Sorrel M.D.   On: 08/10/2019 13:04    Assessment/Plan Active Problems:   Acute on chronic respiratory failure with hypoxia (HCC)   COPD, severe (HCC)   Lobar pneumonia, unspecified organism (HCC)   Acute metabolic encephalopathy   Malignant melanoma of head and neck (HCC)   Chronic pain syndrome   1. Acute on chronic respiratory failure hypoxia plan continue with T collar trials the goal is for 2 hours today. 2. Severe COPD at baseline continue present management 3. Lobar pneumonia treated 4. Metabolic encephalopathy no changes are noted 5. Malignant melanoma treated 6. Chronic pain syndrome we will continue with supportive care   I have personally seen and evaluated the patient,  evaluated laboratory and imaging results, formulated the assessment and plan and placed orders. The Patient requires high complexity decision making for assessment and support.  Case was discussed on Rounds with the Respiratory Therapy Staff  Allyne Gee, MD [facility] Pulmonary Critical Care Medicine Sleep Medicine

## 2019-08-12 DIAGNOSIS — J9621 Acute and chronic respiratory failure with hypoxia: Secondary | ICD-10-CM | POA: Diagnosis not present

## 2019-08-12 DIAGNOSIS — G9341 Metabolic encephalopathy: Secondary | ICD-10-CM | POA: Diagnosis not present

## 2019-08-12 DIAGNOSIS — G894 Chronic pain syndrome: Secondary | ICD-10-CM | POA: Diagnosis not present

## 2019-08-12 DIAGNOSIS — J449 Chronic obstructive pulmonary disease, unspecified: Secondary | ICD-10-CM | POA: Diagnosis not present

## 2019-08-12 LAB — CBC
HCT: 38.4 % (ref 36.0–46.0)
Hemoglobin: 12.1 g/dL (ref 12.0–15.0)
MCH: 28.6 pg (ref 26.0–34.0)
MCHC: 31.5 g/dL (ref 30.0–36.0)
MCV: 90.8 fL (ref 80.0–100.0)
Platelets: 267 10*3/uL (ref 150–400)
RBC: 4.23 MIL/uL (ref 3.87–5.11)
RDW: 17.1 % — ABNORMAL HIGH (ref 11.5–15.5)
WBC: 7.2 10*3/uL (ref 4.0–10.5)
nRBC: 0 % (ref 0.0–0.2)

## 2019-08-12 LAB — BASIC METABOLIC PANEL
Anion gap: 12 (ref 5–15)
BUN: 13 mg/dL (ref 8–23)
CO2: 29 mmol/L (ref 22–32)
Calcium: 9.1 mg/dL (ref 8.9–10.3)
Chloride: 101 mmol/L (ref 98–111)
Creatinine, Ser: 0.91 mg/dL (ref 0.44–1.00)
GFR calc Af Amer: >60 mL/min (ref >60)
GFR calc non Af Amer: >60 mL/min (ref >60)
Glucose, Bld: 116 mg/dL — ABNORMAL HIGH (ref 70–99)
Potassium: 4 mmol/L (ref 3.5–5.1)
Sodium: 142 mmol/L (ref 135–145)

## 2019-08-12 LAB — MAGNESIUM: Magnesium: 2.5 mg/dL — ABNORMAL HIGH (ref 1.7–2.4)

## 2019-08-12 NOTE — Progress Notes (Signed)
Pulmonary Critical Care Medicine [place]   PULMONARY CRITICAL CARE SERVICE  PROGRESS NOTE  Date of Service: 08/12/2019  Latoya Harris  [id number]  DOB: 11-Jan-1946   DOA: 08/07/2019  Referring Physician: Merton Border, MD  HPI: Latoya Harris is a 73 y.o. female seen for follow up of Acute on Chronic Respiratory Failure.  Patient right now is on T collar has been on 28% FiO2 with good saturations  Medications: Reviewed on Rounds  Physical Exam:  Vitals: Temperature 97.7 pulse 59 respiratory 15 blood pressure 123/65 saturations 100%  Ventilator Settings off the ventilator right now on T collar with an FiO2 of 28%  . General: Comfortable at this time . Eyes: Grossly normal lids, irises & conjunctiva . ENT: grossly tongue is normal . Neck: no obvious mass . Cardiovascular: S1 S2 normal no gallop . Respiratory: No rhonchi no rales are noted at this time . Abdomen: soft . Skin: no rash seen on limited exam . Musculoskeletal: not rigid . Psychiatric:unable to assess . Neurologic: no seizure no involuntary movements         Lab Data:   Basic Metabolic Panel: Recent Labs  Lab 08/08/19 0708 08/10/19 0539 08/11/19 1413 08/12/19 0640  NA 144 141 142 142  K 3.5 3.5 4.5 4.0  CL 106 101 102 101  CO2 26 29 28 29   GLUCOSE 127* 144* 149* 116*  BUN 8 12 13 13   CREATININE 0.69 0.86 0.93 0.91  CALCIUM 8.5* 8.9 9.1 9.1  MG 2.0 2.2  --  2.5*  PHOS 3.9  --   --   --     ABG: No results for input(s): PHART, PCO2ART, PO2ART, HCO3, O2SAT in the last 168 hours.  Liver Function Tests: Recent Labs  Lab 08/08/19 0708  AST 14*  ALT 13  ALKPHOS 49  BILITOT 0.6  PROT 6.1*  ALBUMIN 2.8*   No results for input(s): LIPASE, AMYLASE in the last 168 hours. No results for input(s): AMMONIA in the last 168 hours.  CBC: Recent Labs  Lab 08/08/19 0708 08/12/19 0931  WBC 5.8 7.2  NEUTROABS 4.0  --   HGB 10.1* 12.1  HCT 33.4* 38.4  MCV 93.3 90.8  PLT  221 267    Cardiac Enzymes: No results for input(s): CKTOTAL, CKMB, CKMBINDEX, TROPONINI in the last 168 hours.  BNP (last 3 results) No results for input(s): BNP in the last 8760 hours.  ProBNP (last 3 results) No results for input(s): PROBNP in the last 8760 hours.  Radiological Exams: No results found.  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 Acute on chronic respiratory failure hypoxia plan continue with T collar trials the goal is for 8 hours 2. Severe COPD at baseline continue with present management 3. Lobar pneumonia treated clinically improved 4. Acute metabolic encephalopathy unchanged 5. Malignant melanoma treated we will continue to follow 6. Chronic pain controlled   I have personally seen and evaluated the patient, evaluated laboratory and imaging results, formulated the assessment and plan and placed orders. The Patient requires high complexity decision making for assessment and support.  Case was discussed on Rounds with the Respiratory Therapy Staff  Allyne Gee, MD [facility] Pulmonary Critical Care Medicine Sleep Medicine

## 2019-08-13 DIAGNOSIS — G9341 Metabolic encephalopathy: Secondary | ICD-10-CM | POA: Diagnosis not present

## 2019-08-13 DIAGNOSIS — J449 Chronic obstructive pulmonary disease, unspecified: Secondary | ICD-10-CM | POA: Diagnosis not present

## 2019-08-13 DIAGNOSIS — G894 Chronic pain syndrome: Secondary | ICD-10-CM | POA: Diagnosis not present

## 2019-08-13 DIAGNOSIS — J9621 Acute and chronic respiratory failure with hypoxia: Secondary | ICD-10-CM | POA: Diagnosis not present

## 2019-08-13 NOTE — Progress Notes (Signed)
Pulmonary Critical Care Medicine [place]   PULMONARY CRITICAL CARE SERVICE  PROGRESS NOTE  Date of Service: 08/13/2019  Latoya Harris  [id number]  DOB: 1946/06/05   DOA: 08/07/2019  Referring Physician: Merton Border, MD  HPI: Latoya Harris is a 73 y.o. female seen for follow up of Acute on Chronic Respiratory Failure.  Patient currently is on T collar 28% FiO2 with the PMV in place has been off the ventilator for more than 24 hours  Medications: Reviewed on Rounds  Physical Exam:  Vitals: Temperature 97.4 pulse 66 respiratory rate 30 blood pressure 121/64 saturations 100%  Ventilator Settings off the ventilator currently on T collar with an FiO2 of 28%  . General: Comfortable at this time . Eyes: Grossly normal lids, irises & conjunctiva . ENT: grossly tongue is normal . Neck: no obvious mass . Cardiovascular: S1 S2 normal no gallop . Respiratory: No rhonchi coarse breath sounds are noted . Abdomen: soft . Skin: no rash seen on limited exam . Musculoskeletal: not rigid . Psychiatric:unable to assess . Neurologic: no seizure no involuntary movements         Lab Data:   Basic Metabolic Panel: Recent Labs  Lab 08/08/19 0708 08/10/19 0539 08/11/19 1413 08/12/19 0640  NA 144 141 142 142  K 3.5 3.5 4.5 4.0  CL 106 101 102 101  CO2 26 29 28 29   GLUCOSE 127* 144* 149* 116*  BUN 8 12 13 13   CREATININE 0.69 0.86 0.93 0.91  CALCIUM 8.5* 8.9 9.1 9.1  MG 2.0 2.2  --  2.5*  PHOS 3.9  --   --   --     ABG: No results for input(s): PHART, PCO2ART, PO2ART, HCO3, O2SAT in the last 168 hours.  Liver Function Tests: Recent Labs  Lab 08/08/19 0708  AST 14*  ALT 13  ALKPHOS 49  BILITOT 0.6  PROT 6.1*  ALBUMIN 2.8*   No results for input(s): LIPASE, AMYLASE in the last 168 hours. No results for input(s): AMMONIA in the last 168 hours.  CBC: Recent Labs  Lab 08/08/19 0708 08/12/19 0931  WBC 5.8 7.2  NEUTROABS 4.0  --   HGB 10.1*  12.1  HCT 33.4* 38.4  MCV 93.3 90.8  PLT 221 267    Cardiac Enzymes: No results for input(s): CKTOTAL, CKMB, CKMBINDEX, TROPONINI in the last 168 hours.  BNP (last 3 results) No results for input(s): BNP in the last 8760 hours.  ProBNP (last 3 results) No results for input(s): PROBNP in the last 8760 hours.  Radiological Exams: No results found.  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 Acute on chronic respiratory failure with hypoxia plan is to continue with T collar trials patient currently is on 28% has been off the vent for greater than 24 hours 2. Severe COPD at baseline continue with supportive care 3. Lobar pneumonia treated 4. Acute metabolic encephalopathy unchanged 5. Malignant melanoma status post resection 6. Chronic pain controlled continue to follow-up   I have personally seen and evaluated the patient, evaluated laboratory and imaging results, formulated the assessment and plan and placed orders. The Patient requires high complexity decision making for assessment and support.  Case was discussed on Rounds with the Respiratory Therapy Staff  Saadat A  Humphrey Rolls, MD [facility] Pulmonary Critical Care Medicine Sleep Medicine

## 2019-08-14 ENCOUNTER — Other Ambulatory Visit (HOSPITAL_COMMUNITY): Payer: No Typology Code available for payment source

## 2019-08-14 ENCOUNTER — Institutional Professional Consult (permissible substitution) (HOSPITAL_COMMUNITY): Payer: No Typology Code available for payment source

## 2019-08-14 DIAGNOSIS — J449 Chronic obstructive pulmonary disease, unspecified: Secondary | ICD-10-CM | POA: Diagnosis not present

## 2019-08-14 DIAGNOSIS — G894 Chronic pain syndrome: Secondary | ICD-10-CM | POA: Diagnosis not present

## 2019-08-14 DIAGNOSIS — G9341 Metabolic encephalopathy: Secondary | ICD-10-CM | POA: Diagnosis not present

## 2019-08-14 DIAGNOSIS — J9621 Acute and chronic respiratory failure with hypoxia: Secondary | ICD-10-CM | POA: Diagnosis not present

## 2019-08-14 LAB — BLOOD GAS, ARTERIAL
Acid-Base Excess: 5.8 mmol/L — ABNORMAL HIGH (ref 0.0–2.0)
Bicarbonate: 31.3 mmol/L — ABNORMAL HIGH (ref 20.0–28.0)
FIO2: 90
O2 Saturation: 97.4 %
Patient temperature: 37
pCO2 arterial: 59.1 mmHg — ABNORMAL HIGH (ref 32.0–48.0)
pH, Arterial: 7.344 — ABNORMAL LOW (ref 7.350–7.450)
pO2, Arterial: 107 mmHg (ref 83.0–108.0)

## 2019-08-14 NOTE — Progress Notes (Signed)
Pulmonary Critical Care Medicine [place]   PULMONARY CRITICAL CARE SERVICE  PROGRESS NOTE  Date of Service: 08/14/2019  Niti Kohtz  [id number]  DOB: [birth year]   DOA: 08/07/2019  Referring Physician: Merton Border, MD  HPI: Latoya Harris is a 73 y.o. female seen for follow up of Acute on Chronic Respiratory Failure.  Patient currently is on T collar has been on 28% for 2 days secretions are still an issue.  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 General: Comfortable at this time . Eyes: Grossly normal lids, irises & conjunctiva . ENT: grossly tongue is normal . Neck: no obvious mass . Cardiovascular: S1 S2 normal no gallop . Respiratory: No rhonchi coarse breath sounds are noted . Abdomen: soft . Skin: no rash seen on limited exam . Musculoskeletal: not rigid . Psychiatric:unable to assess . Neurologic: no seizure no involuntary movements         Lab Data:   Basic Metabolic Panel: Recent Labs  Lab 08/08/19 0708 08/10/19 0539 08/11/19 1413 08/12/19 0640  NA 144 141 142 142  K 3.5 3.5 4.5 4.0  CL 106 101 102 101  CO2 26 29 28 29   GLUCOSE 127* 144* 149* 116*  BUN 8 12 13 13   CREATININE 0.69 0.86 0.93 0.91  CALCIUM 8.5* 8.9 9.1 9.1  MG 2.0 2.2  --  2.5*  PHOS 3.9  --   --   --     ABG: No results for input(s): PHART, PCO2ART, PO2ART, HCO3, O2SAT in the last 168 hours.  Liver Function Tests: Recent Labs  Lab 08/08/19 0708  AST 14*  ALT 13  ALKPHOS 49  BILITOT 0.6  PROT 6.1*  ALBUMIN 2.8*   No results for input(s): LIPASE,  AMYLASE in the last 168 hours. No results for input(s): AMMONIA in the last 168 hours.  CBC: Recent Labs  Lab 08/08/19 0708 08/12/19 0931  WBC 5.8 7.2  NEUTROABS 4.0  --   HGB 10.1* 12.1  HCT 33.4* 38.4  MCV 93.3 90.8  PLT 221 267    Cardiac Enzymes: No results for input(s): CKTOTAL, CKMB, CKMBINDEX, TROPONINI in the last 168 hours.  BNP (last 3 results) No results for input(s): BNP in the last 8760 hours.  ProBNP (last 3 results) No results for input(s): PROBNP in the last 8760 hours.  Radiological Exams: Ct Abdomen Wo Contrast  Result Date: 08/14/2019 CLINICAL DATA:  Dysphagia, preop planning for gastrostomy tube placement EXAM: CT ABDOMEN WITHOUT CONTRAST TECHNIQUE: Multidetector CT imaging of the abdomen was performed following the standard protocol without IV contrast. COMPARISON:  None available FINDINGS: Lower chest: No pleural or pericardial effusion. Linear scarring or subsegmental atelectasis in the lung bases. Hepatobiliary: No focal liver abnormality is seen. Status post cholecystectomy. No biliary dilatation. Pancreas: Mild diffuse atrophy without mass or ductal dilatation. Spleen: Normal in size without focal abnormality. Adrenals/Urinary Tract: Normal adrenals. No hydronephrosis. Lower poles of the kidneys incompletely visualized. Stomach/Bowel: Stomach is decompressed. There is no direct percutaneous window due to overlying transverse colon and liver. Feeding tube extends to the mid duodenum.  If if visualized portions of small bowel are decompressed. There is mild gaseous distention of the visualized colon. Vascular/Lymphatic: Calcified aortic plaque without aneurysm. No abdominal or mesenteric adenopathy identified. Other: No ascites.  No free air. Musculoskeletal: Multilevel lumbar spondylitic change. No fracture or worrisome bone lesion. IMPRESSION: 1. There is no direct percutaneous approach to the decompressed stomach. Colonic opacification would be required for  evaluation of potential of safe percutaneous window with gastric distension. Alternatively, consider surgical gastrostomy placement. Electronically Signed   By: Lucrezia Europe M.D.   On: 08/14/2019 07:45    Assessment/Plan Active Problems:   Acute on chronic respiratory failure with hypoxia (HCC)   COPD, severe (HCC)   Lobar pneumonia, unspecified organism (HCC)   Acute metabolic encephalopathy   Malignant melanoma of head and neck (HCC)   Chronic pain syndrome   1. Acute on chronic respiratory failure with hypoxia plan is going to be to continue with T collar I do not plan on decannulate on her because of the question of the oropharyngeal mass. 2. Severe COPD at baseline we will continue with present management 3. Lobar pneumonia treated 4. Metabolic encephalopathy unchanged 5. Head neck tumor had biopsy done awaiting pathology 6. Chronic pain controlled we will continue to follow   I have personally seen and evaluated the patient, evaluated laboratory and imaging results, formulated the assessment and plan and placed orders. The Patient requires high complexity decision making for assessment and support.  Case was discussed on Rounds with the Respiratory Therapy Staff  Allyne Gee, MD [facility] Pulmonary Critical Care Medicine Sleep Medicine

## 2019-08-15 DIAGNOSIS — G9341 Metabolic encephalopathy: Secondary | ICD-10-CM | POA: Diagnosis not present

## 2019-08-15 DIAGNOSIS — J9621 Acute and chronic respiratory failure with hypoxia: Secondary | ICD-10-CM | POA: Diagnosis not present

## 2019-08-15 DIAGNOSIS — G894 Chronic pain syndrome: Secondary | ICD-10-CM | POA: Diagnosis not present

## 2019-08-15 DIAGNOSIS — J449 Chronic obstructive pulmonary disease, unspecified: Secondary | ICD-10-CM | POA: Diagnosis not present

## 2019-08-15 LAB — CBC
HCT: 38.7 % (ref 36.0–46.0)
Hemoglobin: 11.9 g/dL — ABNORMAL LOW (ref 12.0–15.0)
MCH: 28.5 pg (ref 26.0–34.0)
MCHC: 30.7 g/dL (ref 30.0–36.0)
MCV: 92.8 fL (ref 80.0–100.0)
Platelets: 219 10*3/uL (ref 150–400)
RBC: 4.17 MIL/uL (ref 3.87–5.11)
RDW: 16.9 % — ABNORMAL HIGH (ref 11.5–15.5)
WBC: 9 10*3/uL (ref 4.0–10.5)
nRBC: 0 % (ref 0.0–0.2)

## 2019-08-15 LAB — BASIC METABOLIC PANEL
Anion gap: 11 (ref 5–15)
BUN: 14 mg/dL (ref 8–23)
CO2: 35 mmol/L — ABNORMAL HIGH (ref 22–32)
Calcium: 9 mg/dL (ref 8.9–10.3)
Chloride: 96 mmol/L — ABNORMAL LOW (ref 98–111)
Creatinine, Ser: 0.79 mg/dL (ref 0.44–1.00)
GFR calc Af Amer: >60 mL/min (ref >60)
GFR calc non Af Amer: >60 mL/min (ref >60)
Glucose, Bld: 161 mg/dL — ABNORMAL HIGH (ref 70–99)
Potassium: 3.9 mmol/L (ref 3.5–5.1)
Sodium: 142 mmol/L (ref 135–145)

## 2019-08-15 LAB — PHOSPHORUS: Phosphorus: 3.6 mg/dL (ref 2.5–4.6)

## 2019-08-15 LAB — MAGNESIUM: Magnesium: 2.3 mg/dL (ref 1.7–2.4)

## 2019-08-15 NOTE — Progress Notes (Signed)
Pulmonary Critical Care Medicine [place]   PULMONARY CRITICAL CARE SERVICE  PROGRESS NOTE  Date of Service: 08/15/2019  Latoya Harris  [id number]  DOB: 26-Aug-1946   DOA: 08/07/2019  Referring Physician: Merton Border, MD  HPI: Latoya Harris is a 73 y.o. female seen for follow up of Acute on Chronic Respiratory Failure.  Patient currently is on pressure support mode is doing well has been on 40% FiO2 with good saturations  Medications: Reviewed on Rounds  Physical Exam:  Vitals: Temperature 98.4 pulse 68 respiratory 18 blood pressure 128/63 saturations 100%  Ventilator Settings mode of ventilation pressure support FiO2 is 40% tidal volume 464 pressure poor 12 PEEP 5  . General: Comfortable at this time . Eyes: Grossly normal lids, irises & conjunctiva . ENT: grossly tongue is normal . Neck: no obvious mass . Cardiovascular: S1 S2 normal no gallop . Respiratory: No rhonchi coarse breath sounds . Abdomen: soft . Skin: no rash seen on limited exam . Musculoskeletal: not rigid . Psychiatric:unable to assess . Neurologic: no seizure no involuntary movements         Lab Data:   Basic Metabolic Panel: Recent Labs  Lab 08/10/19 0539 08/11/19 1413 08/12/19 0640 08/15/19 0429  NA 141 142 142 142  K 3.5 4.5 4.0 3.9  CL 101 102 101 96*  CO2 29 28 29  35*  GLUCOSE 144* 149* 116* 161*  BUN 12 13 13 14   CREATININE 0.86 0.93 0.91 0.79  CALCIUM 8.9 9.1 9.1 9.0  MG 2.2  --  2.5* 2.3  PHOS  --   --   --  3.6    ABG: Recent Labs  Lab 08/14/19 1430  PHART 7.344*  PCO2ART 59.1*  PO2ART 107  HCO3 31.3*  O2SAT 97.4    Liver Function Tests: No results for input(s): AST, ALT, ALKPHOS, BILITOT, PROT, ALBUMIN in the last 168 hours. No results for input(s): LIPASE, AMYLASE in the last 168 hours. No results for input(s): AMMONIA in the last 168 hours.  CBC: Recent Labs  Lab 08/12/19 0931 08/15/19 0429  WBC 7.2 9.0  HGB 12.1 11.9*  HCT 38.4  38.7  MCV 90.8 92.8  PLT 267 219    Cardiac Enzymes: No results for input(s): CKTOTAL, CKMB, CKMBINDEX, TROPONINI in the last 168 hours.  BNP (last 3 results) No results for input(s): BNP in the last 8760 hours.  ProBNP (last 3 results) No results for input(s): PROBNP in the last 8760 hours.  Radiological Exams: Ct Abdomen Wo Contrast  Result Date: 08/14/2019 CLINICAL DATA:  Dysphagia, preop planning for gastrostomy tube placement EXAM: CT ABDOMEN WITHOUT CONTRAST TECHNIQUE: Multidetector CT imaging of the abdomen was performed following the standard protocol without IV contrast. COMPARISON:  None available FINDINGS: Lower chest: No pleural or pericardial effusion. Linear scarring or subsegmental atelectasis in the lung bases. Hepatobiliary: No focal liver abnormality is seen. Status post cholecystectomy. No biliary dilatation. Pancreas: Mild diffuse atrophy without mass or ductal dilatation. Spleen: Normal in size without focal abnormality. Adrenals/Urinary Tract: Normal adrenals. No hydronephrosis. Lower poles of the kidneys incompletely visualized. Stomach/Bowel: Stomach is decompressed. There is no direct percutaneous window due to overlying transverse colon and liver. Feeding tube extends to the mid duodenum. If if visualized portions of small bowel are decompressed. There is mild gaseous distention of the visualized colon. Vascular/Lymphatic: Calcified aortic plaque without aneurysm. No abdominal or mesenteric adenopathy identified. Other: No ascites.  No free air. Musculoskeletal: Multilevel lumbar spondylitic change. No fracture or worrisome  bone lesion. IMPRESSION: 1. There is no direct percutaneous approach to the decompressed stomach. Colonic opacification would be required for evaluation of potential of safe percutaneous window with gastric distension. Alternatively, consider surgical gastrostomy placement. Electronically Signed   By: Lucrezia Europe M.D.   On: 08/14/2019 07:45   Ct Head  Wo Contrast  Result Date: 08/14/2019 CLINICAL DATA:  Altered mental status (AMS), unclear cause EXAM: CT HEAD WITHOUT CONTRAST TECHNIQUE: Contiguous axial images were obtained from the base of the skull through the vertex without intravenous contrast. COMPARISON:  None. FINDINGS: Brain: No intracranial hemorrhage, mass effect, or midline shift. Brain volume is normal for age. No hydrocephalus. The basilar cisterns are patent. No evidence of territorial infarct or acute ischemia. No extra-axial or intracranial fluid collection. Vascular: Atherosclerosis of skullbase vasculature without hyperdense vessel or abnormal calcification. Skull: No fracture or focal lesion. Sinuses/Orbits: Mastoid air cells are hypo pneumatized. Postsurgical change in the paranasal sinuses. No sinus fluid levels. Bilateral cataract resection. Other: None. IMPRESSION: No acute intracranial abnormality. Electronically Signed   By: Keith Rake M.D.   On: 08/14/2019 19:07   Dg Chest Port 1 View  Result Date: 08/14/2019 CLINICAL DATA:  Mucous plugging, respiratory failure EXAM: PORTABLE CHEST 1 VIEW COMPARISON:  08/08/2019 FINDINGS: Low volume AP portable examination without significant change compared to prior examination. Probable bibasilar atelectasis. Tracheostomy. Cardiomegaly. Partially imaged enteric feeding tube. IMPRESSION: 1. Low volume AP portable examination without significant change compared to prior examination. Probable bibasilar atelectasis. Tracheostomy. 2.  Cardiomegaly. Electronically Signed   By: Eddie Candle M.D.   On: 08/14/2019 14:47    Assessment/Plan Active Problems:   Acute on chronic respiratory failure with hypoxia (HCC)   COPD, severe (HCC)   Lobar pneumonia, unspecified organism (HCC)   Acute metabolic encephalopathy   Malignant melanoma of head and neck (HCC)   Chronic pain syndrome   1. Acute on chronic respiratory failure hypoxia plan is to continue with pressure support on the current  settings 12/5 patient is on 40% FiO2 2. Severe COPD nebulizers as necessary 3. Lobar pneumonia has been treated we will continue to follow 4. Acute metabolic encephalopathy slow improvement 5. Head neck tumor unclear etiology no pathology report reportedly has a history of malignant melanoma 6. Chronic pain syndrome controlled   I have personally seen and evaluated the patient, evaluated laboratory and imaging results, formulated the assessment and plan and placed orders. The Patient requires high complexity decision making for assessment and support.  Case was discussed on Rounds with the Respiratory Therapy Staff  Allyne Gee, MD [facility] Pulmonary Critical Care Medicine Sleep Medicine

## 2019-08-16 ENCOUNTER — Other Ambulatory Visit (HOSPITAL_COMMUNITY): Payer: No Typology Code available for payment source

## 2019-08-16 DIAGNOSIS — G9341 Metabolic encephalopathy: Secondary | ICD-10-CM | POA: Diagnosis not present

## 2019-08-16 DIAGNOSIS — J449 Chronic obstructive pulmonary disease, unspecified: Secondary | ICD-10-CM | POA: Diagnosis not present

## 2019-08-16 DIAGNOSIS — G894 Chronic pain syndrome: Secondary | ICD-10-CM | POA: Diagnosis not present

## 2019-08-16 DIAGNOSIS — J9621 Acute and chronic respiratory failure with hypoxia: Secondary | ICD-10-CM | POA: Diagnosis not present

## 2019-08-16 LAB — BLOOD GAS, ARTERIAL
Acid-Base Excess: 8.8 mmol/L — ABNORMAL HIGH (ref 0.0–2.0)
Bicarbonate: 33.6 mmol/L — ABNORMAL HIGH (ref 20.0–28.0)
FIO2: 30
O2 Saturation: 97.3 %
Patient temperature: 37
pCO2 arterial: 53 mmHg — ABNORMAL HIGH (ref 32.0–48.0)
pH, Arterial: 7.418 (ref 7.350–7.450)
pO2, Arterial: 91.5 mmHg (ref 83.0–108.0)

## 2019-08-16 NOTE — Progress Notes (Signed)
Pulmonary Critical Care Medicine [place]   PULMONARY CRITICAL CARE SERVICE  PROGRESS NOTE  Date of Service: 08/16/2019  Latoya Harris  [id number]  DOB: [birth year]   DOA: 08/07/2019  Referring Physician: Merton Border, MD  HPI: Latoya Harris is a 73 y.o. female seen for follow up of Acute on Chronic Respiratory Failure.  Currently on T collar patient is been on 35% FiO2 with good saturations  Medications: Reviewed on Rounds  Physical Exam:  Vitals: Temperature 98.1 pulse 74 respiratory 19 blood pressure 138/62 saturations 99%  Ventilator Settings off the ventilator on T collar  . General: Comfortable at this time . Eyes: Grossly normal lids, irises & conjunctiva . ENT: grossly tongue is normal . Neck: no obvious mass . Cardiovascular: S1 S2 normal no gallop . Respiratory: No rhonchi no rales are noted at this time . Abdomen: soft . Skin: no rash seen on limited exam . Musculoskeletal: not rigid . Psychiatric:unable to assess . Neurologic: no seizure no involuntary movements         Lab Data:   Basic Metabolic Panel: Recent Labs  Lab 08/10/19 0539 08/11/19 1413 08/12/19 0640 08/15/19 0429  NA 141 142 142 142  K 3.5 4.5 4.0 3.9  CL 101 102 101 96*  CO2 29 28 29  35*  GLUCOSE 144* 149* 116* 161*  BUN 12 13 13 14   CREATININE 0.86 0.93 0.91 0.79  CALCIUM 8.9 9.1 9.1 9.0  MG 2.2  --  2.5* 2.3  PHOS  --   --   --  3.6    ABG: Recent Labs  Lab 08/14/19 1430 08/16/19 1320  PHART 7.344* 7.418  PCO2ART 59.1* 53.0*  PO2ART 107 91.5  HCO3 31.3* 33.6*  O2SAT 97.4 97.3    Liver Function Tests: No results for input(s): AST, ALT, ALKPHOS, BILITOT, PROT, ALBUMIN in the last 168 hours. No results for input(s): LIPASE, AMYLASE in the last 168 hours. No results for input(s): AMMONIA in the last 168 hours.  CBC: Recent Labs  Lab 08/12/19 0931 08/15/19 0429  WBC 7.2 9.0  HGB 12.1 11.9*  HCT 38.4 38.7  MCV 90.8 92.8  PLT 267 219     Cardiac Enzymes: No results for input(s): CKTOTAL, CKMB, CKMBINDEX, TROPONINI in the last 168 hours.  BNP (last 3 results) No results for input(s): BNP in the last 8760 hours.  ProBNP (last 3 results) No results for input(s): PROBNP in the last 8760 hours.  Radiological Exams: Ct Head Wo Contrast  Result Date: 08/14/2019 CLINICAL DATA:  Altered mental status (AMS), unclear cause EXAM: CT HEAD WITHOUT CONTRAST TECHNIQUE: Contiguous axial images were obtained from the base of the skull through the vertex without intravenous contrast. COMPARISON:  None. FINDINGS: Brain: No intracranial hemorrhage, mass effect, or midline shift. Brain volume is normal for age. No hydrocephalus. The basilar cisterns are patent. No evidence of territorial infarct or acute ischemia. No extra-axial or intracranial fluid collection. Vascular: Atherosclerosis of skullbase vasculature without hyperdense vessel or abnormal calcification. Skull: No fracture or focal lesion. Sinuses/Orbits: Mastoid air cells are hypo pneumatized. Postsurgical change in the paranasal sinuses. No sinus fluid levels. Bilateral cataract resection. Other: None. IMPRESSION: No acute intracranial abnormality. Electronically Signed   By: Keith Rake M.D.   On: 08/14/2019 19:07   Dg Chest Port 1 View  Result Date: 08/14/2019 CLINICAL DATA:  Mucous plugging, respiratory failure EXAM: PORTABLE CHEST 1 VIEW COMPARISON:  08/08/2019 FINDINGS: Low volume AP portable examination without significant change compared to prior  examination. Probable bibasilar atelectasis. Tracheostomy. Cardiomegaly. Partially imaged enteric feeding tube. IMPRESSION: 1. Low volume AP portable examination without significant change compared to prior examination. Probable bibasilar atelectasis. Tracheostomy. 2.  Cardiomegaly. Electronically Signed   By: Eddie Candle M.D.   On: 08/14/2019 14:47    Assessment/Plan Active Problems:   Acute on chronic respiratory failure  with hypoxia (HCC)   COPD, severe (HCC)   Lobar pneumonia, unspecified organism (HCC)   Acute metabolic encephalopathy   Malignant melanoma of head and neck (HCC)   Chronic pain syndrome   1. Acute on chronic respiratory failure hypoxia plan is to continue with oxygen therapy patient is on 35% FiO2 2. Severe COPD advanced disease we will continue with nebulizers as necessary 3. Lobar pneumonia treated the patient's latest chest x-ray showing some basilar atelectasis 4. Acute metabolic encephalopathy unchanged 5. Head neck cancer follow-up with ENT 6. Chronic pain syndrome stable   I have personally seen and evaluated the patient, evaluated laboratory and imaging results, formulated the assessment and plan and placed orders. The Patient requires high complexity decision making for assessment and support.  Case was discussed on Rounds with the Respiratory Therapy Staff  Allyne Gee, MD [facility] Pulmonary Critical Care Medicine Sleep Medicine

## 2019-08-17 DIAGNOSIS — G894 Chronic pain syndrome: Secondary | ICD-10-CM | POA: Diagnosis not present

## 2019-08-17 DIAGNOSIS — G9341 Metabolic encephalopathy: Secondary | ICD-10-CM | POA: Diagnosis not present

## 2019-08-17 DIAGNOSIS — J449 Chronic obstructive pulmonary disease, unspecified: Secondary | ICD-10-CM | POA: Diagnosis not present

## 2019-08-17 DIAGNOSIS — J9621 Acute and chronic respiratory failure with hypoxia: Secondary | ICD-10-CM | POA: Diagnosis not present

## 2019-08-17 NOTE — Progress Notes (Signed)
Pulmonary Critical Care Medicine [place]   PULMONARY CRITICAL CARE SERVICE  PROGRESS NOTE  Date of Service: 08/17/2019  Latoya Harris  [id number]  DOB: June 15, 1946   DOA: 08/07/2019  Referring Physician: Merton Border, MD  HPI: Latoya Harris is a 73 y.o. female seen for follow up of Acute on Chronic Respiratory Failure.  Patient currently is on T collar has been on 28% FiO2 right now is goal is for 48 hours  Medications: Reviewed on Rounds  Physical Exam:  Vitals: Temperature 97.8 pulse 80 respiratory 21 blood pressure 147/67 saturations 98%  Ventilator Settings off the ventilator on T collar with an FiO2 of 28%  . General: Comfortable at this time . Eyes: Grossly normal lids, irises & conjunctiva . ENT: grossly tongue is normal . Neck: no obvious mass . Cardiovascular: S1 S2 normal no gallop . Respiratory: No rhonchi no rales are noted at this time . Abdomen: soft . Skin: no rash seen on limited exam . Musculoskeletal: not rigid . Psychiatric:unable to assess . Neurologic: no seizure no involuntary movements         Lab Data:   Basic Metabolic Panel: Recent Labs  Lab 08/11/19 1413 08/12/19 0640 08/15/19 0429  NA 142 142 142  K 4.5 4.0 3.9  CL 102 101 96*  CO2 28 29 35*  GLUCOSE 149* 116* 161*  BUN 13 13 14   CREATININE 0.93 0.91 0.79  CALCIUM 9.1 9.1 9.0  MG  --  2.5* 2.3  PHOS  --   --  3.6    ABG: Recent Labs  Lab 08/14/19 1430 08/16/19 1320  PHART 7.344* 7.418  PCO2ART 59.1* 53.0*  PO2ART 107 91.5  HCO3 31.3* 33.6*  O2SAT 97.4 97.3    Liver Function Tests: No results for input(s): AST, ALT, ALKPHOS, BILITOT, PROT, ALBUMIN in the last 168 hours. No results for input(s): LIPASE, AMYLASE in the last 168 hours. No results for input(s): AMMONIA in the last 168 hours.  CBC: Recent Labs  Lab 08/12/19 0931 08/15/19 0429  WBC 7.2 9.0  HGB 12.1 11.9*  HCT 38.4 38.7  MCV 90.8 92.8  PLT 267 219    Cardiac  Enzymes: No results for input(s): CKTOTAL, CKMB, CKMBINDEX, TROPONINI in the last 168 hours.  BNP (last 3 results) No results for input(s): BNP in the last 8760 hours.  ProBNP (last 3 results) No results for input(s): PROBNP in the last 8760 hours.  Radiological Exams: No results found.  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 Acute on chronic respiratory failure with hypoxia plan is to continue with T collar trials as tolerated.  Patient's goal is 48 hours 2. Severe COPD at baseline continue present management 3. Lobar pneumonia treated we will continue to follow 4. Metabolic encephalopathy no changes 5. Malignant melanoma by history 6. Chronic pain syndrome controlled   I have personally seen and evaluated the patient, evaluated laboratory and imaging results, formulated the assessment and plan and placed orders. The Patient requires high complexity decision making for assessment and support.  Case was discussed on Rounds with the Respiratory Therapy Staff  Allyne Gee, MD [facility] Pulmonary Critical Care Medicine Sleep Medicine

## 2019-08-18 DIAGNOSIS — G9341 Metabolic encephalopathy: Secondary | ICD-10-CM | POA: Diagnosis not present

## 2019-08-18 DIAGNOSIS — J449 Chronic obstructive pulmonary disease, unspecified: Secondary | ICD-10-CM | POA: Diagnosis not present

## 2019-08-18 DIAGNOSIS — J9621 Acute and chronic respiratory failure with hypoxia: Secondary | ICD-10-CM | POA: Diagnosis not present

## 2019-08-18 DIAGNOSIS — G894 Chronic pain syndrome: Secondary | ICD-10-CM | POA: Diagnosis not present

## 2019-08-18 NOTE — Progress Notes (Signed)
Pulmonary Critical Care Medicine [place]   PULMONARY CRITICAL CARE SERVICE  PROGRESS NOTE  Date of Service: 08/18/2019  Latoya Harris  [id number]  DOB: 1945-12-31   DOA: 08/07/2019  Referring Physician: Merton Border, MD  HPI: Latoya Harris is a 73 y.o. female seen for follow up of Acute on Chronic Respiratory Failure.  Patient currently is on T collar has been off the ventilator for more than 72 hours  Medications: Reviewed on Rounds  Physical Exam:  Vitals: Temperature 99.1 pulse 72 respiratory 32 blood pressure 145/71 saturations 99%  Ventilator Settings on T collar FiO2 is 28%  . General: Comfortable at this time . Eyes: Grossly normal lids, irises & conjunctiva . ENT: grossly tongue is normal . Neck: no obvious mass . Cardiovascular: S1 S2 normal no gallop . Respiratory: No rhonchi no rales are noted at this time . Abdomen: soft . Skin: no rash seen on limited exam . Musculoskeletal: not rigid . Psychiatric:unable to assess . Neurologic: no seizure no involuntary movements         Lab Data:   Basic Metabolic Panel: Recent Labs  Lab 08/11/19 1413 08/12/19 0640 08/15/19 0429  NA 142 142 142  K 4.5 4.0 3.9  CL 102 101 96*  CO2 28 29 35*  GLUCOSE 149* 116* 161*  BUN 13 13 14   CREATININE 0.93 0.91 0.79  CALCIUM 9.1 9.1 9.0  MG  --  2.5* 2.3  PHOS  --   --  3.6    ABG: Recent Labs  Lab 08/14/19 1430 08/16/19 1320  PHART 7.344* 7.418  PCO2ART 59.1* 53.0*  PO2ART 107 91.5  HCO3 31.3* 33.6*  O2SAT 97.4 97.3    Liver Function Tests: No results for input(s): AST, ALT, ALKPHOS, BILITOT, PROT, ALBUMIN in the last 168 hours. No results for input(s): LIPASE, AMYLASE in the last 168 hours. No results for input(s): AMMONIA in the last 168 hours.  CBC: Recent Labs  Lab 08/12/19 0931 08/15/19 0429  WBC 7.2 9.0  HGB 12.1 11.9*  HCT 38.4 38.7  MCV 90.8 92.8  PLT 267 219    Cardiac Enzymes: No results for input(s):  CKTOTAL, CKMB, CKMBINDEX, TROPONINI in the last 168 hours.  BNP (last 3 results) No results for input(s): BNP in the last 8760 hours.  ProBNP (last 3 results) No results for input(s): PROBNP in the last 8760 hours.  Radiological Exams: No results found.  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 Acute on chronic respiratory failure with hypoxia we will continue with T collar right now is on 28% FiO2 more than 72 hours off the ventilator 2. Severe COPD at baseline we will continue with supportive care 3. Lobar pneumonia treated 4. Malignant melanoma by history 5. Chronic pain syndrome controlled   I have personally seen and evaluated the patient, evaluated laboratory and imaging results, formulated the assessment and plan and placed orders. The Patient requires high complexity decision making for assessment and support.  Case was discussed on Rounds with the Respiratory Therapy Staff  Allyne Gee, MD [facility] Pulmonary Critical Care Medicine Sleep Medicine

## 2019-08-19 DIAGNOSIS — G894 Chronic pain syndrome: Secondary | ICD-10-CM | POA: Diagnosis not present

## 2019-08-19 DIAGNOSIS — J9621 Acute and chronic respiratory failure with hypoxia: Secondary | ICD-10-CM | POA: Diagnosis not present

## 2019-08-19 DIAGNOSIS — J449 Chronic obstructive pulmonary disease, unspecified: Secondary | ICD-10-CM | POA: Diagnosis not present

## 2019-08-19 DIAGNOSIS — G9341 Metabolic encephalopathy: Secondary | ICD-10-CM | POA: Diagnosis not present

## 2019-08-19 LAB — BASIC METABOLIC PANEL
Anion gap: 14 (ref 5–15)
BUN: 16 mg/dL (ref 8–23)
CO2: 31 mmol/L (ref 22–32)
Calcium: 9.3 mg/dL (ref 8.9–10.3)
Chloride: 97 mmol/L — ABNORMAL LOW (ref 98–111)
Creatinine, Ser: 1 mg/dL (ref 0.44–1.00)
GFR calc Af Amer: >60 mL/min (ref >60)
GFR calc non Af Amer: 55 mL/min — ABNORMAL LOW (ref >60)
Glucose, Bld: 143 mg/dL — ABNORMAL HIGH (ref 70–99)
Potassium: 3.7 mmol/L (ref 3.5–5.1)
Sodium: 142 mmol/L (ref 135–145)

## 2019-08-19 LAB — CBC
HCT: 38.7 % (ref 36.0–46.0)
Hemoglobin: 11.7 g/dL — ABNORMAL LOW (ref 12.0–15.0)
MCH: 28.1 pg (ref 26.0–34.0)
MCHC: 30.2 g/dL (ref 30.0–36.0)
MCV: 93 fL (ref 80.0–100.0)
Platelets: 212 10*3/uL (ref 150–400)
RBC: 4.16 MIL/uL (ref 3.87–5.11)
RDW: 16.8 % — ABNORMAL HIGH (ref 11.5–15.5)
WBC: 6.2 10*3/uL (ref 4.0–10.5)
nRBC: 0 % (ref 0.0–0.2)

## 2019-08-19 LAB — MAGNESIUM: Magnesium: 2.4 mg/dL (ref 1.7–2.4)

## 2019-08-19 LAB — PHOSPHORUS: Phosphorus: 5.1 mg/dL — ABNORMAL HIGH (ref 2.5–4.6)

## 2019-08-19 NOTE — Progress Notes (Signed)
Pulmonary Critical Care Medicine [place]   PULMONARY CRITICAL CARE SERVICE  PROGRESS NOTE  Date of Service: 08/19/2019  Latoya Harris  [id number]  DOB: [birth year]   DOA: 08/07/2019  Referring Physician: Merton Border, MD  HPI: Latoya Harris is a 73 y.o. female seen for follow up of Acute on Chronic Respiratory Failure.  Patient currently is on T collar has been on 28% FiO2 should be able to go ahead and change the trach out this morning  Medications: Reviewed on Rounds  Physical Exam:  Vitals: Temperature 97.5 pulse 68 respiratory rate 18 blood pressure 141/69 saturations 98%  Ventilator Settings off the ventilator right now on T collar with an FiO2 of 20%  . General: Comfortable at this time . Eyes: Grossly normal lids, irises & conjunctiva . ENT: grossly tongue is normal . Neck: no obvious mass . Cardiovascular: S1 S2 normal no gallop . Respiratory: No rhonchi no rales are noted at this time . Abdomen: soft . Skin: no rash seen on limited exam . Musculoskeletal: not rigid . Psychiatric:unable to assess . Neurologic: no seizure no involuntary movements         Lab Data:   Basic Metabolic Panel: Recent Labs  Lab 08/15/19 0429 08/19/19 0723  NA 142 142  K 3.9 3.7  CL 96* 97*  CO2 35* 31  GLUCOSE 161* 143*  BUN 14 16  CREATININE 0.79 1.00  CALCIUM 9.0 9.3  MG 2.3 2.4  PHOS 3.6 5.1*    ABG: Recent Labs  Lab 08/14/19 1430 08/16/19 1320  PHART 7.344* 7.418  PCO2ART 59.1* 53.0*  PO2ART 107 91.5  HCO3 31.3* 33.6*  O2SAT 97.4 97.3    Liver Function Tests: No results for input(s): AST, ALT, ALKPHOS, BILITOT, PROT, ALBUMIN in the last 168 hours. No results for input(s): LIPASE, AMYLASE in the last 168 hours. No results for input(s): AMMONIA in the last 168 hours.  CBC: Recent Labs  Lab 08/12/19 0931 08/15/19 0429 08/19/19 0723  WBC 7.2 9.0 6.2  HGB 12.1 11.9* 11.7*  HCT 38.4 38.7 38.7  MCV 90.8 92.8 93.0  PLT 267  219 212    Cardiac Enzymes: No results for input(s): CKTOTAL, CKMB, CKMBINDEX, TROPONINI in the last 168 hours.  BNP (last 3 results) No results for input(s): BNP in the last 8760 hours.  ProBNP (last 3 results) No results for input(s): PROBNP in the last 8760 hours.  Radiological Exams: No results found.  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 Acute on chronic respiratory failure hypoxia we will continue with T collar trials patient is going to be off the vent more than 48 hours 2. Severe COPD at baseline continue present management 3. Lobar pneumonia treated 4. Acute metabolic encephalopathy improving 5. Melanoma by history supportive care 6. Chronic pain syndrome controlled   I have personally seen and evaluated the patient, evaluated laboratory and imaging results, formulated the assessment and plan and placed orders. The Patient requires high complexity decision making for assessment and support.  Case was discussed on Rounds with the Respiratory Therapy Staff  Allyne Gee, MD [facility] Pulmonary Critical Care Medicine Sleep Medicine

## 2019-08-20 ENCOUNTER — Emergency Department (HOSPITAL_COMMUNITY)
Admission: EM | Admit: 2019-08-20 | Discharge: 2019-08-20 | Disposition: A | Discharge status: Home / Self Care | Payer: No Typology Code available for payment source | Attending: Emergency Medicine | Admitting: Emergency Medicine

## 2019-08-20 ENCOUNTER — Inpatient Hospital Stay
Admission: AD | Admit: 2019-08-20 | Discharge: 2019-09-05 | Disposition: A | Discharge status: Skilled Nursing Facility | Payer: PRIVATE HEALTH INSURANCE | Source: Other Acute Inpatient Hospital | Attending: Internal Medicine | Admitting: Internal Medicine

## 2019-08-20 ENCOUNTER — Encounter (HOSPITAL_COMMUNITY): Payer: Self-pay | Admitting: Emergency Medicine

## 2019-08-20 ENCOUNTER — Other Ambulatory Visit: Payer: Self-pay

## 2019-08-20 DIAGNOSIS — R1310 Dysphagia, unspecified: Secondary | ICD-10-CM

## 2019-08-20 DIAGNOSIS — R221 Localized swelling, mass and lump, neck: Secondary | ICD-10-CM | POA: Insufficient documentation

## 2019-08-20 DIAGNOSIS — E1165 Type 2 diabetes mellitus with hyperglycemia: Secondary | ICD-10-CM | POA: Diagnosis not present

## 2019-08-20 DIAGNOSIS — G9341 Metabolic encephalopathy: Secondary | ICD-10-CM | POA: Diagnosis not present

## 2019-08-20 DIAGNOSIS — J181 Lobar pneumonia, unspecified organism: Secondary | ICD-10-CM | POA: Diagnosis present

## 2019-08-20 DIAGNOSIS — G894 Chronic pain syndrome: Secondary | ICD-10-CM | POA: Diagnosis not present

## 2019-08-20 DIAGNOSIS — I890 Lymphedema, not elsewhere classified: Secondary | ICD-10-CM | POA: Insufficient documentation

## 2019-08-20 DIAGNOSIS — Z85820 Personal history of malignant melanoma of skin: Secondary | ICD-10-CM | POA: Diagnosis not present

## 2019-08-20 DIAGNOSIS — J449 Chronic obstructive pulmonary disease, unspecified: Secondary | ICD-10-CM | POA: Insufficient documentation

## 2019-08-20 DIAGNOSIS — E876 Hypokalemia: Secondary | ICD-10-CM | POA: Insufficient documentation

## 2019-08-20 DIAGNOSIS — J9621 Acute and chronic respiratory failure with hypoxia: Secondary | ICD-10-CM | POA: Diagnosis present

## 2019-08-20 DIAGNOSIS — Z4659 Encounter for fitting and adjustment of other gastrointestinal appliance and device: Secondary | ICD-10-CM

## 2019-08-20 DIAGNOSIS — Z9911 Dependence on respirator [ventilator] status: Secondary | ICD-10-CM

## 2019-08-20 NOTE — Consult Note (Signed)
[facility] Surgery Consult Note  Calina Sutera [birth year]  [place]:[id number].    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 She was intubated and later underwent tracheostomy 07/25/2019. Since admission to [facility] she has weaned from the ventilator and is now tolerating trach collar. She is tolerating tube feedings via Cortrak. Unfortunately she continues to fail swallow evaluations. IR was consulted for PEG but due to patient's anatomy and no safe approach for percutaneous G tube, general surgery was consulted for surgical gastrostomy tube placement.    ROS: Review of Systems  Constitutional: Negative.   HENT: Negative.   Eyes: Negative.   Respiratory: Positive for wheezing. Negative for cough and shortness of breath.   Cardiovascular: Positive for leg swelling.  Gastrointestinal: Negative.   Genitourinary: Negative.   Musculoskeletal: Positive for back pain, joint pain and neck pain.  Skin: Negative.   Neurological: Negative.    All systems reviewed and otherwise negative except for as above  No family history on file.  Past Medical History:  Diagnosis Date  . Acute metabolic encephalopathy   . Acute on chronic respiratory failure with hypoxia ([place])   . Chronic pain syndrome   . COPD, severe ([place])   . Lobar pneumonia, unspecified organism ([place])   . Malignant melanoma of head and neck ([place])     Social History:  has no history on file for tobacco, alcohol, and drug.  Allergies:  Allergies  Allergen Reactions  . Aspirin Other (See Comments)    Because of  asthma. Patient stated was told by her doctor not to take due to asthma. Note patient takes NSAID Mobic at home.  . Bee Venom Other (See Comments)  . Clindamycin Hcl Other (See Comments)    (Not in a hospital admission)   Prior to Admission medications   Not on File    Blood pressure 125/66, pulse 77, resp. rate 18, SpO2 95 %. Physical Exam: General: cooperative, chronically ill appearing white female who is laying in bed in NAD HEENT: head is normocephalic, atraumatic.  Sclera are noninjected.  Pupils equal and round and reactive to light.  Ears and nose without any masses or lesions.  Mouth is dry. Poor dentition. Trach in place with PMV Heart: regular, rate, and rhythm.  No obvious murmurs, gallops, or rubs noted.  Palpable pedal pulses bilaterally Lungs: CTAB, trace expiratory wheezes, no rhonchi or rales noted.  Respiratory effort nonlabored. On trach chollar Abd: well healed midline incision, obese, soft, NT/ND, +BS, no masses, hernias, or organomegaly MS: calves soft and nontender with trace edema BLE Skin: warm and dry with no masses, lesions, or rashes Psych: A&Ox3 with an appropriate affect. Neuro: cranial nerves grossly intact, extremity CSM intact bilaterally, normal speech  Results for orders placed or performed during the hospital encounter of 08/07/19 (from the past 48 hour(s))  CBC     Status: Abnormal   Collection Time: 08/19/19  7:23 AM  Result Value Ref Range   WBC 6.2 4.0 - 10.5 K/uL   RBC 4.16 3.87 - 5.11 MIL/uL   Hemoglobin 11.7 (L) 12.0 - 15.0 g/dL   HCT 38.7 36.0 -  46.0 %   MCV 93.0 80.0 - 100.0 fL   MCH 28.1 26.0 - 34.0 pg   MCHC 30.2 30.0 - 36.0 g/dL   RDW 16.8 (H) 11.5 - 15.5 %   Platelets 212 150 - 400 K/uL   nRBC 0.0 0.0 - 0.2 %    Comment: Performed at [facility] [address] [place], [place] [id number]  Basic metabolic panel     Status: Abnormal   Collection Time: 08/19/19  7:23 AM  Result Value Ref Range   Sodium 142 135 - 145 mmol/L    Potassium 3.7 3.5 - 5.1 mmol/L   Chloride 97 (L) 98 - 111 mmol/L   CO2 31 22 - 32 mmol/L   Glucose, Bld 143 (H) 70 - 99 mg/dL   BUN 16 8 - 23 mg/dL   Creatinine, Ser 1.00 0.44 - 1.00 mg/dL   Calcium 9.3 8.9 - 10.3 mg/dL   GFR calc non Af Amer 55 (L) >60 mL/min   GFR calc Af Amer >60 >60 mL/min   Anion gap 14 5 - 15    Comment: Performed at [facility], [place] [address] [place], [place] [address]  Magnesium     Status: None   Collection Time: 08/19/19  7:23 AM  Result Value Ref Range   Magnesium 2.4 1.7 - 2.4 mg/dL    Comment: Performed at [facility] [address] [place], [place] [address]  Phosphorus     Status: Abnormal   Collection Time: 08/19/19  7:23 AM  Result Value Ref Range   Phosphorus 5.1 (H) 2.5 - 4.6 mg/dL    Comment: Performed at [facility] [address], [place], [place] [address]   No results found.    Assessment/Plan COPD on 10mg  prednisone daily Chronic pain PFO on plavix (last dose 12/1) PAD Remote history of melanoma Hypothyroidism Morbid obesity DM Acute on chronic respiratory failure s/p tracheostomy 07/25/2019 - on trach collar  Dysphagia  - currently tolerating tube feedings via Cortrak - IR unable to place PEG due to patient's anatomy and no safe approach for percutaneous G tube - General surgery consulted for surgical gastrostomy tube placement. Recommend cardiology consult for risk stratification with general anesthesia. If patient is cleared for surgery she will need to be off plavix x5 days prior to an operation. She has received a dose today, plan to start holding plavix tomorrow. Recheck covid test preoperatively on Sunday 12/6. Tentatively plan for surgery sometime next week if cleared.  We will touch base with [facility] providers on Monday.   Wellington Hampshire, [place] [facility] 08/20/2019, 2:19 PM Please see Amion for pager number during day hours 7:00am-4:30pm

## 2019-08-20 NOTE — Progress Notes (Signed)
Pulmonary Critical Care Medicine [place]   PULMONARY CRITICAL CARE SERVICE  PROGRESS NOTE  Date of Service: 08/20/2019  Areona Frierson  [id number]  DOB: [birth year]   DOA: 08/07/2019  Referring Physician: Merton Border, MD  HPI: Larrissa Reifschneider is a 73 y.o. female seen for follow up of Acute on Chronic Respiratory Failure.  Patient currently is on T collar has been on 28% FiO2 using PMV  Medications: Reviewed on Rounds  Physical Exam:  Vitals: Temperature is 97.5 pulse 63 respiratory rate 34 blood pressure 140/79 saturations 99%  Ventilator Settings off the ventilator on T collar  . General: Comfortable at this time . Eyes: Grossly normal lids, irises & conjunctiva . ENT: grossly tongue is normal . Neck: no obvious mass . Cardiovascular: S1 S2 normal no gallop . Respiratory: No rhonchi no rales at this time . Abdomen: soft . Skin: no rash seen on limited exam . Musculoskeletal: not rigid . Psychiatric:unable to assess . Neurologic: no seizure no involuntary movements         Lab Data:   Basic Metabolic Panel: Recent Labs  Lab 08/15/19 0429 08/19/19 0723  NA 142 142  K 3.9 3.7  CL 96* 97*  CO2 35* 31  GLUCOSE 161* 143*  BUN 14 16  CREATININE 0.79 1.00  CALCIUM 9.0 9.3  MG 2.3 2.4  PHOS 3.6 5.1*    ABG: Recent Labs  Lab 08/14/19 1430 08/16/19 1320  PHART 7.344* 7.418  PCO2ART 59.1* 53.0*  PO2ART 107 91.5  HCO3 31.3* 33.6*  O2SAT 97.4 97.3    Liver Function Tests: No results for input(s): AST, ALT, ALKPHOS, BILITOT, PROT, ALBUMIN in the last 168 hours. No results for input(s): LIPASE, AMYLASE in the last 168 hours. No results for input(s): AMMONIA in the last 168 hours.  CBC: Recent Labs  Lab 08/15/19 0429 08/19/19 0723  WBC 9.0 6.2  HGB 11.9* 11.7*  HCT 38.7 38.7  MCV 92.8 93.0  PLT 219 212    Cardiac Enzymes: No results for input(s): CKTOTAL, CKMB, CKMBINDEX, TROPONINI in the last 168 hours.  BNP (last 3  results) No results for input(s): BNP in the last 8760 hours.  ProBNP (last 3 results) No results for input(s): PROBNP in the last 8760 hours.  Radiological Exams: No results found.  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 Acute on chronic respiratory failure hypoxia patient is doing well with the PMV plan is to continue with PMV as tolerated.  Some question regarding the neoplasm that was alluded to in the chart apparently the primary care physician has called the nurse practitioner here and stated patient did not have cancer nonetheless patient did have a mass in the oropharynx described so it may not be the best idea to feed currently.  Discussed the possibility of the placing a PEG. 2. Severe COPD at baseline we will continue present management 3. Lobar pneumonia treated we will continue with supportive care 4. Acute metabolic encephalopathy slowly improving we will continue to follow along. 5. Chronic pain continue with supportive care   I have personally seen and evaluated the patient, evaluated laboratory and imaging results, formulated the assessment and plan and placed orders. The Patient requires high complexity decision making for assessment and support.  Case  was discussed on Rounds with the Respiratory Therapy Staff  Allyne Gee, MD [facility] Pulmonary Critical Care Medicine Sleep Medicine

## 2019-08-20 NOTE — ED Triage Notes (Signed)
[facility] pt here for g-tube consult w/ general surgery. AOx4, VSS.

## 2019-08-20 NOTE — ED Notes (Signed)
Report called to chentelle RN at [facility], Discharge instructions discussed with Pt. Pt verbalized understanding. Pt stable and leaving in bed to go upstairs with this RN.

## 2019-08-20 NOTE — Consult Note (Signed)
Referring Physician:  [name] is an 73 y.o. female.                       Chief Complaint: Pre-Op evaluation  HPI: 73 years old female admitted for acute on chronic respiratory failure with hypoxia. She is on T collar with 99 % oxygen saturation. She has PMH of COPD with exacerbation, pneumonia, malignant melanoma of head and neck, metabolic encephalopathy and type 2 DM.  She is awaiting G tube placement as current NG tube feeding is interfering with additional weaning off respirator. She had preserved LV systolic function last month.  She is on Plavix post PFO detection in 2018 post syncope evaluation. Plavix is on hold for G tube placement next week. Patient denies chest pain. She has chronic shortness of breath.  Past Medical History:  Diagnosis Date  . Acute metabolic encephalopathy   . Acute on chronic respiratory failure with hypoxia ([place])   . Chronic pain syndrome   . COPD, severe ([place])   . Lobar pneumonia, unspecified organism ([place])   . 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 Cancer, Asthma to a sister  Social History: She has no history of smoking tobacco but she used tobacco snuff. No drug use. Unknown alcohol use. She is a widow.  Allergies:  Allergies  Allergen Reactions  . Aspirin Other (See Comments)    Because of asthma. Patient stated was told by her doctor not to take due to asthma. Note patient takes NSAID Mobic at home.  . Bee Venom Other (See Comments)  . Clindamycin Hcl Other (See Comments)    Medications .  Amantadine 100 mg. Daily. Amlodipine 5 mg. Daily. Budesonide 0.5 mg. Inhalation daily. Buspirone 30  mg. Bid Clonazepam 1 mg. Bid. Clopidogrel 75 mg. One daily. Digestive advantage 1 cap daily Duloxetine 60 mg. Daily. Famotidine 20 mg. One bid. Fentanyl 25 mcg/hr patch q 72 hr. Fish oil 4000 mg. Daily. Furosemide 20 mg. One twice daily. Gabapentin 300 mg. Two cap twice daily. Heparin 5000 units SQ q 8 hr. Ipratropium - Albuterol nebulizer tid. Lantus 30 units SQ q hs. Levothyroxine 200 mcg. Daily. Loratadine 10 mg. One daily. Melatonin 3 mg. q hs. Nutrisource fiber pack tid. Prednisone 10 mg. One daily. Pro-Stat 30 ml. Bid. Refresh tears 0.5 % soln. One drop tid. Ropinirole 0.5 mg. tab, 2.5 mg. Bid. Senna Plus 2 tab bid Thera M plus one daily. Vitamin B-12 1000 mcg. Daily.  Results for orders placed or performed during the hospital encounter of 08/07/19 (from the past 48 hour(s))  CBC     Status: Abnormal   Collection Time: 08/19/19  7:23 AM  Result Value Ref Range   WBC 6.2 4.0 - 10.5 K/uL   RBC 4.16 3.87 - 5.11 MIL/uL   Hemoglobin 11.7 (L) 12.0 - 15.0 g/dL   HCT 38.7 36.0 - 46.0 %   MCV 93.0 80.0 - 100.0 fL   MCH 28.1 26.0 - 34.0 pg   MCHC 30.2 30.0 - 36.0 g/dL   RDW 16.8 (H) 11.5 - 15.5 %   Platelets 212 150 - 400 K/uL   nRBC 0.0 0.0 - 0.2 %  Comment: Performed at [facility], [place] [address], [place], [place] [id number]  Basic metabolic panel     Status: Abnormal   Collection Time: 08/19/19  7:23 AM  Result Value Ref Range   Sodium 142 135 - 145 mmol/L   Potassium 3.7 3.5 - 5.1 mmol/L   Chloride 97 (L) 98 - 111 mmol/L   CO2 31 22 - 32 mmol/L   Glucose, Bld 143 (H) 70 - 99 mg/dL   BUN 16 8 - 23 mg/dL   Creatinine, Ser 1.00 0.44 - 1.00 mg/dL   Calcium 9.3 8.9 - 10.3 mg/dL   GFR calc non Af Amer 55 (L) >60 mL/min   GFR calc Af Amer >60 >60 mL/min   Anion gap 14 5 - 15    Comment: Performed at [facility], [place] [address] [place], [place] [address]  Magnesium     Status: None   Collection Time: 08/19/19  7:23 AM  Result Value Ref Range    Magnesium 2.4 1.7 - 2.4 mg/dL    Comment: Performed at [facility] [address] [place], [place] [address]  Phosphorus     Status: Abnormal   Collection Time: 08/19/19  7:23 AM  Result Value Ref Range   Phosphorus 5.1 (H) 2.5 - 4.6 mg/dL    Comment: Performed at [facility] [address] [place],  [address]   No results found.  Review Of Systems As per HPI and PMH.  P: 65, R: 30, BP : 120/80. O2 sat 97 %. There were no vitals taken for this visit. There is no height or weight on file to calculate BMI. General appearance: alert, cooperative, appears stated age and no distress Head: Normocephalic, atraumatic. Eyes: Blue eyes, pink conjunctiva, corneas clear. PERRL, EOM's intact. Neck: No adenopathy, no carotid bruit, no JVD, supple, symmetrical, T-collar, 28 % FiO2 usi9ng PMV and thyroid not enlarged. Resp: Clearing to auscultation bilaterally. Cardio: Regular rate and rhythm, S1, S2 normal, II/VI systolic murmur, no click, rub or gallop GI: Soft, non-tender; bowel sounds normal; no organomegaly. Extremities: No edema, cyanosis or clubbing. Skin: Warm and dry.  Neurologic: Alert and oriented X 2, normal strength.   Assessment/Plan Acute on chronic respiratory failure with hypoxia Severe COPD Type 2 DM Obesity Lobar pneumonia Recent metabolic encephalopathy Malignant melanoma of head and neck Chronic pain syndrome  Agree with holding clopidogrel at least 5 days prior to placing G-tube. Awaiting repeat echocardiogram for LV function. Continue other medications and oxygen/ventilator support.  Time spent: Review of old records, Lab, x-rays, EKG, other cardiac tests, examination, discussion with patient, nurse, referring doctor over 70 minutes.  Birdie Riddle, MD  08/20/2019, 4:49 PM

## 2019-08-20 NOTE — ED Provider Notes (Signed)
[facility] EMERGENCY DEPARTMENT Provider Note   CSN: [id number] Arrival date & time: 08/20/19  1259     History   Chief Complaint Chief Complaint  Patient presents with  . G-tube consult    HPI Latoya Harris is a 73 y.o. female history of COPD, chronic pain syndrome, lung melanoma of head and neck who presents for evaluation of G-tube placement and general surgery consult.  Patient was sent over by Dr. Humphrey Rolls for general surgery consultation for possible G-tube placement.  He had noticed worsening dysphagia and noticed that patient was increasingly more uncomfortable.  She is from [facility] and was sent down here to have further evaluation.  Patient states that she has not had too much trouble swallowing but does report occasionally, the food will get stuck.  She has not any trouble breathing.     The history is provided by the patient.    Past Medical History:  Diagnosis Date  . Acute metabolic encephalopathy   . Acute on chronic respiratory failure with hypoxia ([place])   . Chronic pain syndrome   . COPD, severe ([place])   . Lobar pneumonia, unspecified organism ([place])   . Malignant melanoma of head and neck [facility])     Patient Active Problem List   Diagnosis Date Noted  . Hypokalemia 08/20/2019  . Lymphedema of upper extremity 08/20/2019  . Morbid obesity ([place]) 08/20/2019  . Neck swelling 08/20/2019  . Acute on chronic respiratory failure with hypoxia ([place])   . COPD, severe ([place])   . Lobar pneumonia, unspecified organism ([place])   . Acute metabolic encephalopathy   . Malignant melanoma of head and neck ([place])   . Chronic pain syndrome   . Airway compromise 07/23/2019  . Type 2 diabetes mellitus with hyperglycemia, without long-term current use of insulin ([place]) 02/27/2019  . Acute respiratory failure with hypoxia ([place]) 11/15/2017  . Essential hypertension 07/03/2017  . PFO (patent foramen ovale) 07/03/2017  . Infestation by bed bug 03/08/2016  . History of  depression 03/07/2016  . Seizure disorder ([place]) 03/07/2016  . History of melanoma 01/08/2016  . Leukocytosis 07/15/2015  . Acute bronchitis due to infection 07/02/2015  . Chronic obstructive pulmonary disease with (acute) exacerbation ([place]) 05/04/2015  . Age-related nuclear cataract, left eye 08/28/2014  . Acquired hypothyroidism 09/19/1898  . 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 Diaphragmatic hernia 09/19/1898  . Gastroesophageal reflux disease without esophagitis 09/19/1898  . Migraine 09/19/1898  . Osteoarthritis of knee 09/19/1898  . Pure hypercholesterolemia 09/19/1898  . Fibromyalgia 09/19/1898  . Pneumonia, unspecified organism 09/19/1898     The histories are not reviewed yet. Please review them in the "History" navigator section and refresh this [place].   OB History   No obstetric history on file.      Home Medications    Prior to Admission medications   Not on File    Family History No family history on file.  Social History Social History   Tobacco Use  . Smoking status: Not on file  Substance Use Topics  . Alcohol use: Not on file  . Drug use: Not on file     Allergies   Aspirin, Bee venom, and Clindamycin hcl   Review of Systems Review of Systems  Constitutional: Negative for fever.  HENT: Positive for trouble swallowing.   Respiratory: Negative for shortness of breath.   Cardiovascular: Negative for chest pain.  Gastrointestinal: Negative for abdominal pain, nausea and vomiting.  Neurological: Negative for  headaches.  All other systems reviewed and are negative.    Physical Exam Updated Vital Signs BP (!) 118/57   Pulse 76   Resp 15   SpO2 97%   Physical Exam Vitals signs and nursing note reviewed.  Constitutional:      Appearance: Normal appearance. She is well-developed.  HENT:     Head: Normocephalic and atraumatic.  Eyes:     General: Lids are normal.     Conjunctiva/sclera:  Conjunctivae normal.     Pupils: Pupils are equal, round, and reactive to light.  Neck:     Musculoskeletal: Full passive range of motion without pain.     Trachea: Tracheostomy present.     Comments: Trach noted. Cardiovascular:     Rate and Rhythm: Normal rate and regular rhythm.     Pulses: Normal pulses.     Heart sounds: Normal heart sounds. No murmur. No friction rub. No gallop.   Pulmonary:     Effort: Pulmonary effort is normal.     Breath sounds: Normal breath sounds.     Comments: Noisy breath sounds.  No evidence of wheezing, rales. Abdominal:     Palpations: Abdomen is soft. Abdomen is not rigid.     Tenderness: There is no abdominal tenderness. There is no guarding.     Comments: Abdomen is soft, non-distended, non-tender. No rigidity, No guarding. No peritoneal signs.  Musculoskeletal: Normal range of motion.  Skin:    General: Skin is warm and dry.     Capillary Refill: Capillary refill takes less than 2 seconds.  Neurological:     Mental Status: She is alert and oriented to person, place, and time.  Psychiatric:        Speech: Speech normal.      ED Treatments / Results  Labs (all labs ordered are listed, but only abnormal results are displayed) Labs Reviewed - No data to display  EKG None  Radiology No results found.  Procedures Procedures (including critical care time)  Medications Ordered in ED Medications - No data to display   Initial Impression / Assessment and Plan / ED Course  I have reviewed the triage vital signs and the nursing notes.  Pertinent labs & imaging results that were available during my care of the patient were reviewed by me and considered in my medical decision making (see chart for details).        73 year old female who presents for evaluation of general surgery consult.  Patient with history of malignant melanoma to head and neck.  Sent over by Dr. Yancey Flemings for evaluation of possible G-tube placement.  Surgery notified on  patient's arrival.  Patient is afebrile, nontoxic-appearing.  She does have a trach noted but shows no signs of respiratory distress.  Surgery will consult on patient.  Discussed with Jerene Pitch, general surgery PA after evaluation here in the emergency department.  [facility] go [facility].  They will plan to do the procedure in 5 days.  Patient should hold on her Plavix for that procedure may take place.  Patient cleared from surgery standpoint and is cleared to go back to [facility].  At this time, patient is tolerating secretions any difficulty.  No evidence of respiratory stress.  She is hemodynamically stable.  Patient can go back to [facility].  Discussed patient with Dr. Stark Jock who is agreeable to plan.   Portions of this note were generated with Lobbyist. Dictation errors may occur despite best attempts at proofreading.  Final Clinical Impressions(s) / ED Diagnoses   Final diagnoses:  Dysphagia, unspecified type    ED Discharge Orders    None       Volanda Napoleon, PA-C 08/20/19 1605    Veryl Speak, MD 08/21/19 [phone]

## 2019-08-21 ENCOUNTER — Other Ambulatory Visit (HOSPITAL_COMMUNITY): Payer: Self-pay

## 2019-08-21 DIAGNOSIS — J181 Lobar pneumonia, unspecified organism: Secondary | ICD-10-CM

## 2019-08-21 DIAGNOSIS — G894 Chronic pain syndrome: Secondary | ICD-10-CM

## 2019-08-21 DIAGNOSIS — J449 Chronic obstructive pulmonary disease, unspecified: Secondary | ICD-10-CM

## 2019-08-21 DIAGNOSIS — G9341 Metabolic encephalopathy: Secondary | ICD-10-CM

## 2019-08-21 DIAGNOSIS — J9621 Acute and chronic respiratory failure with hypoxia: Secondary | ICD-10-CM

## 2019-08-21 LAB — ECHOCARDIOGRAM COMPLETE

## 2019-08-21 NOTE — Consult Note (Signed)
Ref: Thomasenia Bottoms, PA-C   Subjective:  Cough continues. Awaiting G tube placement. Off Plavix since yesterday. Echocardiogram: Good LV systolic function with mild MR and TR.  Objective:  Vital Signs in the last 24 hours:    Physical Exam: P: 76, R: 30, BP: 114/53, T: 98.0 BP Readings from Last 1 Encounters:  08/20/19 (!) 118/57     Wt Readings from Last 1 Encounters:  No data found for Wt    Weight change:  There is no height or weight on file to calculate BMI. HEENT: [place]/AT, Eyes-Blue, Conjunctiva-Pink, Sclera-Non-icteric. NG tube in place.  Neck: No JVD, No bruit, T-collar in place. Lungs:  Harsh, Bilateral. Cardiac:  Regular rhythm, normal S1 and S2, no S3. II/VI systolic murmur. Abdomen:  Soft, non-tender. BS present. Extremities:  No edema present. No cyanosis. No clubbing. CNS: AxOx3, Cranial nerves grossly intact, moves all 4 extremities.  Skin: Warm and dry.   Intake/Output from previous day: No intake/output data recorded.    Lab Results: BMET    Component Value Date/Time   NA 142 08/19/2019 0723   NA 142 08/15/2019 0429   NA 142 08/12/2019 0640   K 3.7 08/19/2019 0723   K 3.9 08/15/2019 0429   K 4.0 08/12/2019 0640   CL 97 (L) 08/19/2019 0723   CL 96 (L) 08/15/2019 0429   CL 101 08/12/2019 0640   CO2 31 08/19/2019 0723   CO2 35 (H) 08/15/2019 0429   CO2 29 08/12/2019 0640   GLUCOSE 143 (H) 08/19/2019 0723   GLUCOSE 161 (H) 08/15/2019 0429   GLUCOSE 116 (H) 08/12/2019 0640   BUN 16 08/19/2019 0723   BUN 14 08/15/2019 0429   BUN 13 08/12/2019 0640   CREATININE 1.00 08/19/2019 0723   CREATININE 0.79 08/15/2019 0429   CREATININE 0.91 08/12/2019 0640   CALCIUM 9.3 08/19/2019 0723   CALCIUM 9.0 08/15/2019 0429   CALCIUM 9.1 08/12/2019 0640   GFRNONAA 55 (L) 08/19/2019 0723   GFRNONAA >60 08/15/2019 0429   GFRNONAA >60 08/12/2019 0640   GFRAA >60 08/19/2019 0723   GFRAA >60 08/15/2019 0429   GFRAA >60 08/12/2019 0640   CBC    Component Value  Date/Time   WBC 6.2 08/19/2019 0723   RBC 4.16 08/19/2019 0723   HGB 11.7 (L) 08/19/2019 0723   HCT 38.7 08/19/2019 0723   PLT 212 08/19/2019 0723   MCV 93.0 08/19/2019 0723   MCH 28.1 08/19/2019 0723   MCHC 30.2 08/19/2019 0723   RDW 16.8 (H) 08/19/2019 0723   LYMPHSABS 1.2 08/08/2019 0708   MONOABS 0.4 08/08/2019 0708   EOSABS 0.2 08/08/2019 0708   BASOSABS 0.0 08/08/2019 0708   HEPATIC Function Panel Recent Labs    08/08/19 0708  PROT 6.1*   HEMOGLOBIN A1C No components found for: HGA1C,  MPG CARDIAC ENZYMES No results found for: CKTOTAL, CKMB, CKMBINDEX, TROPONINI BNP No results for input(s): PROBNP in the last 8760 hours. TSH Recent Labs    08/08/19 0708  TSH 3.293   CHOLESTEROL No results for input(s): CHOL in the last 8760 hours.  Scheduled Meds: Continuous Infusions: PRN Meds:.  Assessment/Plan: Acute on chronic respiratory failure with hypoxemia Severe COPD Type 2 DM Obesity Lobar pneumonia Malignant melanoma of head and neck. Chronic pain syndrome  May undergo G tube placement next week.   LOS: 0 days   Time spent including chart review, lab review, examination, discussion with patient : 30 min   Dixie Dials  MD  08/21/2019, 6:32  PM

## 2019-08-21 NOTE — Progress Notes (Signed)
Pulmonary Critical Care Medicine [place]   PULMONARY CRITICAL CARE SERVICE  PROGRESS NOTE  Date of Service: 08/21/2019  Latoya Harris  [id number]  DOB: [birth year]   DOA: 08/20/2019  Referring Physician: Merton Border, MD  HPI: Latoya Harris is a 73 y.o. female seen for follow up of Acute on Chronic Respiratory Failure.  She is on T collar resting comfortably right now without distress  Medications: Reviewed on Rounds  Physical Exam:  Vitals: Temperature 98.0 pulse 76 respiratory rate 30 blood pressure 114/53  Ventilator Settings off the ventilator on T collar with an FiO2 of 28%  . General: Comfortable at this time . Eyes: Grossly normal lids, irises & conjunctiva . ENT: grossly tongue is normal . Neck: no obvious mass . Cardiovascular: S1 S2 normal no gallop . Respiratory: No rhonchi coarse breath sounds noted at this time . Abdomen: soft . Skin: no rash seen on limited exam . Musculoskeletal: not rigid . Psychiatric:unable to assess . Neurologic: no seizure no involuntary movements         Lab Data:   Basic Metabolic Panel: Recent Labs  Lab 08/15/19 0429 08/19/19 0723  NA 142 142  K 3.9 3.7  CL 96* 97*  CO2 35* 31  GLUCOSE 161* 143*  BUN 14 16  CREATININE 0.79 1.00  CALCIUM 9.0 9.3  MG 2.3 2.4  PHOS 3.6 5.1*    ABG: Recent Labs  Lab 08/14/19 1430 08/16/19 1320  PHART 7.344* 7.418  PCO2ART 59.1* 53.0*  PO2ART 107 91.5  HCO3 31.3* 33.6*  O2SAT 97.4 97.3    Liver Function Tests: No results for input(s): AST, ALT, ALKPHOS, BILITOT, PROT, ALBUMIN in the last 168 hours. No results for input(s): LIPASE, AMYLASE in the last 168 hours. No results for input(s): AMMONIA in the last 168 hours.  CBC: Recent Labs  Lab 08/15/19 0429 08/19/19 0723  WBC 9.0 6.2  HGB 11.9* 11.7*  HCT 38.7 38.7  MCV 92.8 93.0  PLT 219 212    Cardiac Enzymes: No results for input(s): CKTOTAL, CKMB, CKMBINDEX, TROPONINI in the last 168  hours.  BNP (last 3 results) No results for input(s): BNP in the last 8760 hours.  ProBNP (last 3 results) No results for input(s): PROBNP in the last 8760 hours.  Radiological Exams: No results found.  Assessment/Plan Active Problems:   Acute on chronic respiratory failure with hypoxia (HCC)   COPD, severe (HCC)   Lobar pneumonia, unspecified organism (HCC)   Acute metabolic encephalopathy   Chronic pain syndrome   1. Acute on chronic respiratory failure with hypoxia patient is off the ventilator this will be her baseline right now is doing well with 28% FiO2 secretions are fair to moderate 2. Severe COPD at baseline we will continue present management 3. Lobar pneumonia treated resolved 4. Metabolic encephalopathy she is at her baseline 5. Chronic pain controlled we will continue to follow   I have personally seen and evaluated the patient, evaluated laboratory and imaging results, formulated the assessment and plan and placed orders. The Patient requires high complexity decision making for assessment and support.  Case was discussed on Rounds with the Respiratory Therapy Staff  Allyne Gee, MD [facility] Pulmonary Critical Care Medicine Sleep Medicine

## 2019-08-21 NOTE — Progress Notes (Signed)
  Echocardiogram 2D Echocardiogram has been performed.  Latoya Harris 08/21/2019, 3:33 PM

## 2019-08-22 DIAGNOSIS — G9341 Metabolic encephalopathy: Secondary | ICD-10-CM | POA: Diagnosis not present

## 2019-08-22 DIAGNOSIS — G894 Chronic pain syndrome: Secondary | ICD-10-CM | POA: Diagnosis not present

## 2019-08-22 DIAGNOSIS — J9621 Acute and chronic respiratory failure with hypoxia: Secondary | ICD-10-CM | POA: Diagnosis not present

## 2019-08-22 DIAGNOSIS — J449 Chronic obstructive pulmonary disease, unspecified: Secondary | ICD-10-CM | POA: Diagnosis not present

## 2019-08-22 NOTE — Progress Notes (Signed)
Pulmonary Critical Care Medicine [place]   PULMONARY CRITICAL CARE SERVICE  PROGRESS NOTE  Date of Service: 08/22/2019  Latoya Harris  [id number]  DOB: [birth year]   DOA: 08/20/2019  Referring Physician: Merton Border, MD  HPI: Latoya Harris is a 73 y.o. female seen for follow up of Acute on Chronic Respiratory Failure.  Patient right now is on T collar has been on 28% FiO2 was attempted at the [facility] however developed stridorous sounds so therefore the PMV was discontinued.  Based on the fact that she has an oropharyngeal mass likely is not going to be decannulated  Medications: Reviewed on Rounds  Physical Exam:  Vitals: Temperature 97.4 pulse 64 respiratory 18 blood pressure 123/62 saturations 99%  Ventilator Settings on T collar FiO2 is 28%  . General: Comfortable at this time . Eyes: Grossly normal lids, irises & conjunctiva . ENT: grossly tongue is normal . Neck: no obvious mass . Cardiovascular: S1 S2 normal no gallop . Respiratory: No rhonchi no rales are noted at this time . Abdomen: soft . Skin: no rash seen on limited exam . Musculoskeletal: not rigid . Psychiatric:unable to assess . Neurologic: no seizure no involuntary movements         Lab Data:   Basic Metabolic Panel: Recent Labs  Lab 08/19/19 0723  NA 142  K 3.7  CL 97*  CO2 31  GLUCOSE 143*  BUN 16  CREATININE 1.00  CALCIUM 9.3  MG 2.4  PHOS 5.1*    ABG: Recent Labs  Lab 08/16/19 1320  PHART 7.418  PCO2ART 53.0*  PO2ART 91.5  HCO3 33.6*  O2SAT 97.3    Liver Function Tests: No results for input(s): AST, ALT, ALKPHOS, BILITOT, PROT, ALBUMIN in the last 168 hours. No results for input(s): LIPASE, AMYLASE in the last 168 hours. No results for input(s): AMMONIA in the last 168 hours.  CBC: Recent Labs  Lab 08/19/19 0723  WBC 6.2  HGB 11.7*  HCT 38.7  MCV 93.0  PLT 212    Cardiac Enzymes: No results for input(s): CKTOTAL, CKMB, CKMBINDEX, TROPONINI  in the last 168 hours.  BNP (last 3 results) No results for input(s): BNP in the last 8760 hours.  ProBNP (last 3 results) No results for input(s): PROBNP in the last 8760 hours.  Radiological Exams: No results found.  Assessment/Plan Active Problems:   Acute on chronic respiratory failure with hypoxia (HCC)   COPD, severe (HCC)   Lobar pneumonia, unspecified organism (HCC)   Acute metabolic encephalopathy   Chronic pain syndrome   1. Acute on chronic respiratory failure hypoxia we will continue with T collar patient has had stridor as already noted 2. Severe COPD at baseline we will continue present management 3. Lobar pneumonia treated 4. Acute metabolic encephalopathy improved 5. Chronic pain syndrome controlled   I have personally seen and evaluated the patient, evaluated laboratory and imaging results, formulated the assessment and plan and placed orders. The Patient requires high complexity decision making for assessment and support.  Case was discussed on Rounds with the Respiratory Therapy Staff  Allyne Gee, MD [facility] Pulmonary Critical Care Medicine Sleep Medicine

## 2019-08-23 DIAGNOSIS — G894 Chronic pain syndrome: Secondary | ICD-10-CM | POA: Diagnosis not present

## 2019-08-23 DIAGNOSIS — J9621 Acute and chronic respiratory failure with hypoxia: Secondary | ICD-10-CM | POA: Diagnosis not present

## 2019-08-23 DIAGNOSIS — G9341 Metabolic encephalopathy: Secondary | ICD-10-CM | POA: Diagnosis not present

## 2019-08-23 DIAGNOSIS — J449 Chronic obstructive pulmonary disease, unspecified: Secondary | ICD-10-CM | POA: Diagnosis not present

## 2019-08-23 LAB — BASIC METABOLIC PANEL
Anion gap: 10 (ref 5–15)
BUN: 19 mg/dL (ref 8–23)
CO2: 32 mmol/L (ref 22–32)
Calcium: 8.9 mg/dL (ref 8.9–10.3)
Chloride: 98 mmol/L (ref 98–111)
Creatinine, Ser: 0.77 mg/dL (ref 0.44–1.00)
GFR calc Af Amer: >60 mL/min (ref >60)
GFR calc non Af Amer: >60 mL/min (ref >60)
Glucose, Bld: 130 mg/dL — ABNORMAL HIGH (ref 70–99)
Potassium: 3.4 mmol/L — ABNORMAL LOW (ref 3.5–5.1)
Sodium: 140 mmol/L (ref 135–145)

## 2019-08-23 LAB — MAGNESIUM: Magnesium: 2.3 mg/dL (ref 1.7–2.4)

## 2019-08-23 LAB — CBC
HCT: 36.1 % (ref 36.0–46.0)
Hemoglobin: 11.1 g/dL — ABNORMAL LOW (ref 12.0–15.0)
MCH: 28.3 pg (ref 26.0–34.0)
MCHC: 30.7 g/dL (ref 30.0–36.0)
MCV: 92.1 fL (ref 80.0–100.0)
Platelets: 209 10*3/uL (ref 150–400)
RBC: 3.92 MIL/uL (ref 3.87–5.11)
RDW: 16.4 % — ABNORMAL HIGH (ref 11.5–15.5)
WBC: 5.8 10*3/uL (ref 4.0–10.5)
nRBC: 0 % (ref 0.0–0.2)

## 2019-08-23 NOTE — Consult Note (Signed)
Ref: Thomasenia Bottoms, PA-C   Subjective:  Off Plavix but one day late. VS stable.   Objective:  Vital Signs in the last 24 hours:  P: 72, R: 24, BP: 110/50, T: 98.0 degree F.  Physical Exam: BP Readings from Last 1 Encounters:  08/20/19 (!) 118/57     Wt Readings from Last 1 Encounters:  No data found for Wt    Weight change:  There is no height or weight on file to calculate BMI. HEENT: /AT, Eyes-Blue, PERL, EOMI, Conjunctiva-Pink, Sclera-Non-icteric. NG tube in place. Neck: No JVD, No bruit, Trachea midline. T-collar in place. Lungs:  Harsh with cough, Bilateral. Cardiac:  Regular rhythm, normal S1 and S2, no S3. II/VI systolic murmur. Abdomen:  Soft, non-tender. BS present. Extremities:  No edema present. No cyanosis. No clubbing. CNS: AxOx3, Cranial nerves grossly intact, moves all 4 extremities.  Skin: Warm and dry.   Intake/Output from previous day: No intake/output data recorded.    Lab Results: BMET    Component Value Date/Time   NA 140 08/23/2019 0657   NA 142 08/19/2019 0723   NA 142 08/15/2019 0429   K 3.4 (L) 08/23/2019 0657   K 3.7 08/19/2019 0723   K 3.9 08/15/2019 0429   CL 98 08/23/2019 0657   CL 97 (L) 08/19/2019 0723   CL 96 (L) 08/15/2019 0429   CO2 32 08/23/2019 0657   CO2 31 08/19/2019 0723   CO2 35 (H) 08/15/2019 0429   GLUCOSE 130 (H) 08/23/2019 0657   GLUCOSE 143 (H) 08/19/2019 0723   GLUCOSE 161 (H) 08/15/2019 0429   BUN 19 08/23/2019 0657   BUN 16 08/19/2019 0723   BUN 14 08/15/2019 0429   CREATININE 0.77 08/23/2019 0657   CREATININE 1.00 08/19/2019 0723   CREATININE 0.79 08/15/2019 0429   CALCIUM 8.9 08/23/2019 0657   CALCIUM 9.3 08/19/2019 0723   CALCIUM 9.0 08/15/2019 0429   GFRNONAA >60 08/23/2019 0657   GFRNONAA 55 (L) 08/19/2019 0723   GFRNONAA >60 08/15/2019 0429   GFRAA >60 08/23/2019 0657   GFRAA >60 08/19/2019 0723   GFRAA >60 08/15/2019 0429   CBC    Component Value Date/Time   WBC 5.8 08/23/2019 0657   RBC 3.92  08/23/2019 0657   HGB 11.1 (L) 08/23/2019 0657   HCT 36.1 08/23/2019 0657   PLT 209 08/23/2019 0657   MCV 92.1 08/23/2019 0657   MCH 28.3 08/23/2019 0657   MCHC 30.7 08/23/2019 0657   RDW 16.4 (H) 08/23/2019 0657   LYMPHSABS 1.2 08/08/2019 0708   MONOABS 0.4 08/08/2019 0708   EOSABS 0.2 08/08/2019 0708   BASOSABS 0.0 08/08/2019 0708   HEPATIC Function Panel Recent Labs    08/08/19 0708  PROT 6.1*   HEMOGLOBIN A1C No components found for: HGA1C,  MPG CARDIAC ENZYMES No results found for: CKTOTAL, CKMB, CKMBINDEX, TROPONINI BNP No results for input(s): PROBNP in the last 8760 hours. TSH Recent Labs    08/08/19 0708  TSH 3.293   CHOLESTEROL No results for input(s): CHOL in the last 8760 hours.  Scheduled Meds: Continuous Infusions: PRN Meds:.  Assessment/Plan: Acute on chronic respiratory failure with hypoxemia Severe COPD Type 2 DM Obesity Malignant melanoma of head and neck Chronic pain syndrome  Postpone G tube placement till Tuesday or Wednesday of next week.   LOS: 0 days   Time spent including chart review, lab review, examination, discussion with patient : 30 min   Dixie Dials  MD  08/23/2019, 10:56 AM

## 2019-08-23 NOTE — Progress Notes (Signed)
Pulmonary Critical Care Medicine [place]   PULMONARY CRITICAL CARE SERVICE  PROGRESS NOTE  Date of Service: 08/23/2019  Latoya Harris  [id number]  DOB: 30-Oct-1945   DOA: 08/20/2019  Referring Physician: Merton Border, MD  HPI: Latoya Harris is a 73 y.o. female seen for follow up of Acute on Chronic Respiratory Failure.  Patient is on T collar currently on 28% FiO2 with good saturations.  Secretions still remain an issue being addressed  Medications: Reviewed on Rounds  Physical Exam:  Vitals: Temperature 97.0 pulse 60 respiratory 15 blood pressure 130/53 saturations 98%  Ventilator Settings off the ventilator on T collar with an FiO2 28%  . General: Comfortable at this time . Eyes: Grossly normal lids, irises & conjunctiva . ENT: grossly tongue is normal . Neck: no obvious mass . Cardiovascular: S1 S2 normal no gallop . Respiratory: No rhonchi no rales are noted at this time . Abdomen: soft . Skin: no rash seen on limited exam . Musculoskeletal: not rigid . Psychiatric:unable to assess . Neurologic: no seizure no involuntary movements         Lab Data:   Basic Metabolic Panel: Recent Labs  Lab 08/19/19 0723 08/23/19 0657  NA 142 140  K 3.7 3.4*  CL 97* 98  CO2 31 32  GLUCOSE 143* 130*  BUN 16 19  CREATININE 1.00 0.77  CALCIUM 9.3 8.9  MG 2.4 2.3  PHOS 5.1*  --     ABG: Recent Labs  Lab 08/16/19 1320  PHART 7.418  PCO2ART 53.0*  PO2ART 91.5  HCO3 33.6*  O2SAT 97.3    Liver Function Tests: No results for input(s): AST, ALT, ALKPHOS, BILITOT, PROT, ALBUMIN in the last 168 hours. No results for input(s): LIPASE, AMYLASE in the last 168 hours. No results for input(s): AMMONIA in the last 168 hours.  CBC: Recent Labs  Lab 08/19/19 0723 08/23/19 0657  WBC 6.2 5.8  HGB 11.7* 11.1*  HCT 38.7 36.1  MCV 93.0 92.1  PLT 212 209    Cardiac Enzymes: No results for input(s): CKTOTAL, CKMB, CKMBINDEX, TROPONINI in the last  168 hours.  BNP (last 3 results) No results for input(s): BNP in the last 8760 hours.  ProBNP (last 3 results) No results for input(s): PROBNP in the last 8760 hours.  Radiological Exams: No results found.  Assessment/Plan Active Problems:   Acute on chronic respiratory failure with hypoxia (HCC)   COPD, severe (HCC)   Lobar pneumonia, unspecified organism (HCC)   Acute metabolic encephalopathy   Chronic pain syndrome   1. Acute on chronic respiratory failure hypoxia plan is to continue T collar trial secretions are fair to moderate continue aggressive pulmonary toilet 2. Severe COPD at baseline 3. Nebulizer treatment as necessary 4. Lobar pneumonia treated we will continue with supportive care 5. Metabolic encephalopathy no change 6. Chronic pain syndrome controlled   I have personally seen and evaluated the patient, evaluated laboratory and imaging results, formulated the assessment and plan and placed orders. The Patient requires high complexity decision making for assessment and support.  Case was discussed on Rounds with the Respiratory Therapy Staff  Latoya Gee, MD [facility] Pulmonary Critical Care Medicine Sleep Medicine

## 2019-08-24 DIAGNOSIS — G9341 Metabolic encephalopathy: Secondary | ICD-10-CM | POA: Diagnosis not present

## 2019-08-24 DIAGNOSIS — J9621 Acute and chronic respiratory failure with hypoxia: Secondary | ICD-10-CM | POA: Diagnosis not present

## 2019-08-24 DIAGNOSIS — G894 Chronic pain syndrome: Secondary | ICD-10-CM | POA: Diagnosis not present

## 2019-08-24 DIAGNOSIS — J449 Chronic obstructive pulmonary disease, unspecified: Secondary | ICD-10-CM | POA: Diagnosis not present

## 2019-08-24 LAB — BASIC METABOLIC PANEL
Anion gap: 13 (ref 5–15)
BUN: 17 mg/dL (ref 8–23)
CO2: 29 mmol/L (ref 22–32)
Calcium: 9 mg/dL (ref 8.9–10.3)
Chloride: 98 mmol/L (ref 98–111)
Creatinine, Ser: 0.89 mg/dL (ref 0.44–1.00)
GFR calc Af Amer: >60 mL/min (ref >60)
GFR calc non Af Amer: >60 mL/min (ref >60)
Glucose, Bld: 147 mg/dL — ABNORMAL HIGH (ref 70–99)
Potassium: 3.7 mmol/L (ref 3.5–5.1)
Sodium: 140 mmol/L (ref 135–145)

## 2019-08-24 LAB — POTASSIUM: Potassium: 5.1 mmol/L (ref 3.5–5.1)

## 2019-08-24 NOTE — Progress Notes (Signed)
Pulmonary Critical Care Medicine [place]   PULMONARY CRITICAL CARE SERVICE  PROGRESS NOTE  Date of Service: 08/24/2019  Latoya Harris  [id number]  DOB: [birth year]   DOA: 08/20/2019  Referring Physician: Merton Border, MD  HPI: Latoya Harris is a 73 y.o. female seen for follow up of Acute on Chronic Respiratory Failure.  Patient currently is on T collar has been on 28% FiO2 using PMV  Medications: Reviewed on Rounds  Physical Exam:  Vitals: Temperature 97.6 pulse 73 respiratory rate 25 blood pressure 123/64 saturations 93%  Ventilator Settings off the ventilator on T collar currently on 28% FiO2  . General: Comfortable at this time . Eyes: Grossly normal lids, irises & conjunctiva . ENT: grossly tongue is normal . Neck: no obvious mass . Cardiovascular: S1 S2 normal no gallop . Respiratory: No rhonchi no rales are noted at this time . Abdomen: soft . Skin: no rash seen on limited exam . Musculoskeletal: not rigid . Psychiatric:unable to assess . Neurologic: no seizure no involuntary movements         Lab Data:   Basic Metabolic Panel: Recent Labs  Lab 08/19/19 0723 08/23/19 0657 08/24/19 0542 08/24/19 1052  NA 142 140  --  140  K 3.7 3.4* 5.1 3.7  CL 97* 98  --  98  CO2 31 32  --  29  GLUCOSE 143* 130*  --  147*  BUN 16 19  --  17  CREATININE 1.00 0.77  --  0.89  CALCIUM 9.3 8.9  --  9.0  MG 2.4 2.3  --   --   PHOS 5.1*  --   --   --     ABG: No results for input(s): PHART, PCO2ART, PO2ART, HCO3, O2SAT in the last 168 hours.  Liver Function Tests: No results for input(s): AST, ALT, ALKPHOS, BILITOT, PROT, ALBUMIN in the last 168 hours. No results for input(s): LIPASE, AMYLASE in the last 168 hours. No results for input(s): AMMONIA in the last 168 hours.  CBC: Recent Labs  Lab 08/19/19 0723 08/23/19 0657  WBC 6.2 5.8  HGB 11.7* 11.1*  HCT 38.7 36.1  MCV 93.0 92.1  PLT 212 209    Cardiac Enzymes: No results for  input(s): CKTOTAL, CKMB, CKMBINDEX, TROPONINI in the last 168 hours.  BNP (last 3 results) No results for input(s): BNP in the last 8760 hours.  ProBNP (last 3 results) No results for input(s): PROBNP in the last 8760 hours.  Radiological Exams: No results found.  Assessment/Plan Active Problems:   Acute on chronic respiratory failure with hypoxia (HCC)   COPD, severe (HCC)   Lobar pneumonia, unspecified organism (HCC)   Acute metabolic encephalopathy   Chronic pain syndrome   1. Acute on chronic respiratory failure with hypoxia we will continue with T collar trials patient has also been tolerating the PMV secretions are fair to moderate 2. Severe COPD at baseline continue present management 3. Lobar pneumonia treated we will continue to follow along 4. Metabolic encephalopathy no change 5. Chronic pain controlled   I have personally seen and evaluated the patient, evaluated laboratory and imaging results, formulated the assessment and plan and placed orders. The Patient requires high complexity decision making for assessment and support.  Case was discussed on Rounds with the Respiratory Therapy Staff  Allyne Gee, MD [facility] Pulmonary Critical Care Medicine Sleep Medicine

## 2019-08-25 DIAGNOSIS — G9341 Metabolic encephalopathy: Secondary | ICD-10-CM | POA: Diagnosis not present

## 2019-08-25 DIAGNOSIS — J449 Chronic obstructive pulmonary disease, unspecified: Secondary | ICD-10-CM | POA: Diagnosis not present

## 2019-08-25 DIAGNOSIS — J9621 Acute and chronic respiratory failure with hypoxia: Secondary | ICD-10-CM | POA: Diagnosis not present

## 2019-08-25 DIAGNOSIS — G894 Chronic pain syndrome: Secondary | ICD-10-CM | POA: Diagnosis not present

## 2019-08-25 LAB — SARS CORONAVIRUS 2 (TAT 6-24 HRS): SARS Coronavirus 2: NEGATIVE

## 2019-08-25 NOTE — Progress Notes (Signed)
Pulmonary Critical Care Medicine [place]   PULMONARY CRITICAL CARE SERVICE  PROGRESS NOTE  Date of Service: 08/25/2019  Abishai Poss  [id number]  DOB: Nov 16, 1945   DOA: 08/20/2019  Referring Physician: Merton Border, MD  HPI: Arieyana Bredehoeft is a 73 y.o. female seen for follow up of Acute on Chronic Respiratory Failure.  Patient right now is on T collar has been on 30% FiO2 comfortable without distress  Medications: Reviewed on Rounds  Physical Exam:  Vitals: Temperature 97.2 pulse 75 respiratory 18 blood pressure 127/61 saturations 97%  Ventilator Settings currently on T collar with an FiO2 of 30%  . General: Comfortable at this time . Eyes: Grossly normal lids, irises & conjunctiva . ENT: grossly tongue is normal . Neck: no obvious mass . Cardiovascular: S1 S2 normal no gallop . Respiratory: No rhonchi coarse breath sounds are noted at this time . Abdomen: soft . Skin: no rash seen on limited exam . Musculoskeletal: not rigid . Psychiatric:unable to assess . Neurologic: no seizure no involuntary movements         Lab Data:   Basic Metabolic Panel: Recent Labs  Lab 08/19/19 0723 08/23/19 0657 08/24/19 0542 08/24/19 1052  NA 142 140  --  140  K 3.7 3.4* 5.1 3.7  CL 97* 98  --  98  CO2 31 32  --  29  GLUCOSE 143* 130*  --  147*  BUN 16 19  --  17  CREATININE 1.00 0.77  --  0.89  CALCIUM 9.3 8.9  --  9.0  MG 2.4 2.3  --   --   PHOS 5.1*  --   --   --     ABG: No results for input(s): PHART, PCO2ART, PO2ART, HCO3, O2SAT in the last 168 hours.  Liver Function Tests: No results for input(s): AST, ALT, ALKPHOS, BILITOT, PROT, ALBUMIN in the last 168 hours. No results for input(s): LIPASE, AMYLASE in the last 168 hours. No results for input(s): AMMONIA in the last 168 hours.  CBC: Recent Labs  Lab 08/19/19 0723 08/23/19 0657  WBC 6.2 5.8  HGB 11.7* 11.1*  HCT 38.7 36.1  MCV 93.0 92.1  PLT 212 209    Cardiac Enzymes: No  results for input(s): CKTOTAL, CKMB, CKMBINDEX, TROPONINI in the last 168 hours.  BNP (last 3 results) No results for input(s): BNP in the last 8760 hours.  ProBNP (last 3 results) No results for input(s): PROBNP in the last 8760 hours.  Radiological Exams: No results found.  Assessment/Plan Active Problems:   Acute on chronic respiratory failure with hypoxia (HCC)   COPD, severe (HCC)   Lobar pneumonia, unspecified organism (HCC)   Acute metabolic encephalopathy   Chronic pain syndrome   1. Acute on chronic respiratory failure with hypoxia plan is to continue with T collar trials patient is doing well will continue on 28% oxygen at this time 2. Severe COPD at baseline continue with supportive care 3. Lobar pneumonia no changes are noted 4. Acute metabolic encephalopathy unchanged 5. Chronic pain syndrome at baseline continue with supportive care   I have personally seen and evaluated the patient, evaluated laboratory and imaging results, formulated the assessment and plan and placed orders. The Patient requires high complexity decision making for assessment and support.  Case was discussed on Rounds with the Respiratory Therapy Staff  Allyne Gee, MD [facility] Pulmonary Critical Care Medicine Sleep Medicine

## 2019-08-26 DIAGNOSIS — J449 Chronic obstructive pulmonary disease, unspecified: Secondary | ICD-10-CM | POA: Diagnosis not present

## 2019-08-26 DIAGNOSIS — G894 Chronic pain syndrome: Secondary | ICD-10-CM | POA: Diagnosis not present

## 2019-08-26 DIAGNOSIS — J9621 Acute and chronic respiratory failure with hypoxia: Secondary | ICD-10-CM | POA: Diagnosis not present

## 2019-08-26 DIAGNOSIS — G9341 Metabolic encephalopathy: Secondary | ICD-10-CM | POA: Diagnosis not present

## 2019-08-26 LAB — BASIC METABOLIC PANEL
Anion gap: 10 (ref 5–15)
BUN: 17 mg/dL (ref 8–23)
CO2: 34 mmol/L — ABNORMAL HIGH (ref 22–32)
Calcium: 9.3 mg/dL (ref 8.9–10.3)
Chloride: 100 mmol/L (ref 98–111)
Creatinine, Ser: 0.89 mg/dL (ref 0.44–1.00)
GFR calc Af Amer: >60 mL/min (ref >60)
GFR calc non Af Amer: >60 mL/min (ref >60)
Glucose, Bld: 111 mg/dL — ABNORMAL HIGH (ref 70–99)
Potassium: 3.7 mmol/L (ref 3.5–5.1)
Sodium: 144 mmol/L (ref 135–145)

## 2019-08-26 LAB — CBC
HCT: 37.6 % (ref 36.0–46.0)
Hemoglobin: 11.4 g/dL — ABNORMAL LOW (ref 12.0–15.0)
MCH: 28.6 pg (ref 26.0–34.0)
MCHC: 30.3 g/dL (ref 30.0–36.0)
MCV: 94.2 fL (ref 80.0–100.0)
Platelets: 222 10*3/uL (ref 150–400)
RBC: 3.99 MIL/uL (ref 3.87–5.11)
RDW: 16.5 % — ABNORMAL HIGH (ref 11.5–15.5)
WBC: 6.5 10*3/uL (ref 4.0–10.5)
nRBC: 0 % (ref 0.0–0.2)

## 2019-08-26 LAB — MAGNESIUM: Magnesium: 2.3 mg/dL (ref 1.7–2.4)

## 2019-08-26 NOTE — Progress Notes (Signed)
Pulmonary Critical Care Medicine [place]   PULMONARY CRITICAL CARE SERVICE  PROGRESS NOTE  Date of Service: 08/26/2019  Latoya Harris  [id number]  DOB: 09/01/1946   DOA: 08/20/2019  Referring Physician: Merton Border, MD  HPI: Latoya Harris is a 73 y.o. female seen for follow up of Acute on Chronic Respiratory Failure.  Patient is doing relatively well has been on 20% FiO2 with good saturations at this time.  Medications: Reviewed on Rounds  Physical Exam:  Vitals: Temperature 97.6 pulse 66 respiratory rate is 17 blood pressure is 123/56 saturations 100%  Ventilator Settings on T collar with an FiO2 of 28% on PMV  . General: Comfortable at this time . Eyes: Grossly normal lids, irises & conjunctiva . ENT: grossly tongue is normal . Neck: no obvious mass . Cardiovascular: S1 S2 normal no gallop . Respiratory: No rhonchi no rales are noted at this time . Abdomen: soft . Skin: no rash seen on limited exam . Musculoskeletal: not rigid . Psychiatric:unable to assess . Neurologic: no seizure no involuntary movements         Lab Data:   Basic Metabolic Panel: Recent Labs  Lab 08/23/19 0657 08/24/19 0542 08/24/19 1052 08/26/19 0701  NA 140  --  140 144  K 3.4* 5.1 3.7 3.7  CL 98  --  98 100  CO2 32  --  29 34*  GLUCOSE 130*  --  147* 111*  BUN 19  --  17 17  CREATININE 0.77  --  0.89 0.89  CALCIUM 8.9  --  9.0 9.3  MG 2.3  --   --  2.3    ABG: No results for input(s): PHART, PCO2ART, PO2ART, HCO3, O2SAT in the last 168 hours.  Liver Function Tests: No results for input(s): AST, ALT, ALKPHOS, BILITOT, PROT, ALBUMIN in the last 168 hours. No results for input(s): LIPASE, AMYLASE in the last 168 hours. No results for input(s): AMMONIA in the last 168 hours.  CBC: Recent Labs  Lab 08/23/19 0657 08/26/19 0701  WBC 5.8 6.5  HGB 11.1* 11.4*  HCT 36.1 37.6  MCV 92.1 94.2  PLT 209 222    Cardiac Enzymes: No results for input(s):  CKTOTAL, CKMB, CKMBINDEX, TROPONINI in the last 168 hours.  BNP (last 3 results) No results for input(s): BNP in the last 8760 hours.  ProBNP (last 3 results) No results for input(s): PROBNP in the last 8760 hours.  Radiological Exams: No results found.  Assessment/Plan Active Problems:   Acute on chronic respiratory failure with hypoxia (HCC)   COPD, severe (HCC)   Lobar pneumonia, unspecified organism (HCC)   Acute metabolic encephalopathy   Chronic pain syndrome   1. Acute on chronic respiratory failurePlan is to continue with T collar weaning as tolerated continue secretion management pulmonary toilet. 2. Severe COPD at baseline we will continue to follow. 3. Lobar pneumonia treated clinically is improving 4. Acute metabolic encephalopathy no change 5. Chronic pain controlled continue to follow   I have personally seen and evaluated the patient, evaluated laboratory and imaging results, formulated the assessment and plan and placed orders. The Patient requires high complexity decision making for assessment and support.  Case was discussed on Rounds with the Respiratory Therapy Staff  Allyne Gee, MD [facility] Pulmonary Critical Care Medicine Sleep Medicine

## 2019-08-27 ENCOUNTER — Institutional Professional Consult (permissible substitution) (HOSPITAL_COMMUNITY): Payer: Self-pay

## 2019-08-27 ENCOUNTER — Encounter (HOSPITAL_COMMUNITY): Payer: Self-pay | Admitting: Surgery

## 2019-08-27 ENCOUNTER — Other Ambulatory Visit (HOSPITAL_COMMUNITY): Payer: Self-pay

## 2019-08-27 ENCOUNTER — Encounter
Admission: AD | Disposition: A | Discharge status: Skilled Nursing Facility | Payer: Self-pay | Attending: Internal Medicine

## 2019-08-27 ENCOUNTER — Inpatient Hospital Stay (HOSPITAL_COMMUNITY)
Admission: RE | Admit: 2019-08-27 | Payer: No Typology Code available for payment source | Source: Home / Self Care | Admitting: Surgery

## 2019-08-27 ENCOUNTER — Encounter (HOSPITAL_COMMUNITY): Payer: Self-pay | Admitting: Anesthesiology

## 2019-08-27 DIAGNOSIS — G9341 Metabolic encephalopathy: Secondary | ICD-10-CM | POA: Diagnosis not present

## 2019-08-27 DIAGNOSIS — J449 Chronic obstructive pulmonary disease, unspecified: Secondary | ICD-10-CM | POA: Diagnosis not present

## 2019-08-27 DIAGNOSIS — J9621 Acute and chronic respiratory failure with hypoxia: Secondary | ICD-10-CM | POA: Diagnosis not present

## 2019-08-27 DIAGNOSIS — G894 Chronic pain syndrome: Secondary | ICD-10-CM | POA: Diagnosis not present

## 2019-08-27 HISTORY — PX: LAPAROSCOPIC INSERTION GASTROSTOMY TUBE: SHX6817

## 2019-08-27 LAB — BASIC METABOLIC PANEL
Anion gap: 12 (ref 5–15)
BUN: 19 mg/dL (ref 8–23)
CO2: 28 mmol/L (ref 22–32)
Calcium: 8.8 mg/dL — ABNORMAL LOW (ref 8.9–10.3)
Chloride: 100 mmol/L (ref 98–111)
Creatinine, Ser: 1.04 mg/dL — ABNORMAL HIGH (ref 0.44–1.00)
GFR calc Af Amer: >60 mL/min (ref >60)
GFR calc non Af Amer: 53 mL/min — ABNORMAL LOW (ref >60)
Glucose, Bld: 158 mg/dL — ABNORMAL HIGH (ref 70–99)
Potassium: 4.5 mmol/L (ref 3.5–5.1)
Sodium: 140 mmol/L (ref 135–145)

## 2019-08-27 LAB — CBC
HCT: 38.6 % (ref 36.0–46.0)
Hemoglobin: 11.9 g/dL — ABNORMAL LOW (ref 12.0–15.0)
MCH: 28.6 pg (ref 26.0–34.0)
MCHC: 30.8 g/dL (ref 30.0–36.0)
MCV: 92.8 fL (ref 80.0–100.0)
Platelets: 220 10*3/uL (ref 150–400)
RBC: 4.16 MIL/uL (ref 3.87–5.11)
RDW: 16.4 % — ABNORMAL HIGH (ref 11.5–15.5)
WBC: 12.2 10*3/uL — ABNORMAL HIGH (ref 4.0–10.5)
nRBC: 0 % (ref 0.0–0.2)

## 2019-08-27 LAB — PHOSPHORUS: Phosphorus: 4.4 mg/dL (ref 2.5–4.6)

## 2019-08-27 LAB — TROPONIN I (HIGH SENSITIVITY): Troponin I (High Sensitivity): 14 ng/L (ref <18)

## 2019-08-27 LAB — MAGNESIUM: Magnesium: 2.1 mg/dL (ref 1.7–2.4)

## 2019-08-27 SURGERY — INSERTION OF GASTROSTOMY TUBE
Anesthesia: General

## 2019-08-27 SURGERY — INSERTION, GASTROSTOMY TUBE, PERCUTANEOUS
Anesthesia: General | Site: Abdomen

## 2019-08-27 MED ORDER — LACTATED RINGERS IV SOLN
INTRAVENOUS | Status: DC | PRN
  Administered 2019-08-27: 13:00:00 via INTRAVENOUS

## 2019-08-27 MED ORDER — ROCURONIUM BROMIDE 10 MG/ML (PF) SYRINGE
PREFILLED_SYRINGE | INTRAVENOUS | Status: DC | PRN
  Administered 2019-08-27: 10 mg via INTRAVENOUS
  Administered 2019-08-27: 20 mg via INTRAVENOUS
  Administered 2019-08-27: 30 mg via INTRAVENOUS

## 2019-08-27 MED ORDER — FENTANYL CITRATE (PF) 250 MCG/5ML IJ SOLN
INTRAMUSCULAR | Status: DC | PRN
  Administered 2019-08-27 (×2): 50 ug via INTRAVENOUS

## 2019-08-27 MED ORDER — CLOPIDOGREL BISULFATE 75 MG PO TABS: 75.0000 mg | ORAL_TABLET | Freq: Every day | ORAL | Status: AC

## 2019-08-27 MED ORDER — SUGAMMADEX SODIUM 200 MG/2ML IV SOLN
INTRAVENOUS | Status: DC | PRN
  Administered 2019-08-27: 200 mg via INTRAVENOUS

## 2019-08-27 MED ORDER — BUPIVACAINE HCL (PF) 0.25 % IJ SOLN
INTRAMUSCULAR | Status: AC
  Filled 2019-08-27: qty 30

## 2019-08-27 MED ORDER — PHENYLEPHRINE 40 MCG/ML (10ML) SYRINGE FOR IV PUSH (FOR BLOOD PRESSURE SUPPORT)
PREFILLED_SYRINGE | INTRAVENOUS | Status: DC | PRN
  Administered 2019-08-27: 40 ug via INTRAVENOUS

## 2019-08-27 MED ORDER — ONDANSETRON HCL 4 MG/2ML IJ SOLN
INTRAMUSCULAR | Status: DC | PRN
  Administered 2019-08-27: 4 mg via INTRAVENOUS

## 2019-08-27 MED ORDER — PROMETHAZINE HCL 25 MG/ML IJ SOLN: 6.2500 mg | INTRAMUSCULAR | Status: DC | PRN

## 2019-08-27 MED ORDER — FENTANYL CITRATE (PF) 250 MCG/5ML IJ SOLN
INTRAMUSCULAR | Status: AC
  Filled 2019-08-27: qty 5

## 2019-08-27 MED ORDER — BUPIVACAINE HCL (PF) 0.25 % IJ SOLN
INTRAMUSCULAR | Status: DC | PRN
  Administered 2019-08-27: 9 mL

## 2019-08-27 MED ORDER — FENTANYL CITRATE (PF) 100 MCG/2ML IJ SOLN: 25.0000 ug | INTRAMUSCULAR | Status: DC | PRN

## 2019-08-27 MED ORDER — DEXAMETHASONE SODIUM PHOSPHATE 10 MG/ML IJ SOLN
INTRAMUSCULAR | Status: DC | PRN
  Administered 2019-08-27: 4 mg via INTRAVENOUS

## 2019-08-27 MED ORDER — CEFAZOLIN SODIUM-DEXTROSE 2-3 GM-%(50ML) IV SOLR
INTRAVENOUS | Status: DC | PRN
  Administered 2019-08-27: 2 g via INTRAVENOUS

## 2019-08-27 SURGICAL SUPPLY — 52 items
BAG URINE DRAINAGE (UROLOGICAL SUPPLIES) IMPLANT
BENZOIN TINCTURE PRP APPL 2/3 (GAUZE/BANDAGES/DRESSINGS) ×3 IMPLANT
BINDER ABDOMINAL 12 ML 46-62 (SOFTGOODS) ×3 IMPLANT
BLADE CLIPPER SURG (BLADE) IMPLANT
CANISTER SUCT 3000ML PPV (MISCELLANEOUS) IMPLANT
CATH DRAINAGE MALECOT 26FR (CATHETERS) IMPLANT
CATH MALECOT (CATHETERS)
CATH MALECOT BARD  24FR (CATHETERS)
CATH MALECOT BARD 24FR (CATHETERS) IMPLANT
CHLORAPREP W/TINT 26 (MISCELLANEOUS) ×3 IMPLANT
COVER SURGICAL LIGHT HANDLE (MISCELLANEOUS) ×3 IMPLANT
COVER WAND RF STERILE (DRAPES) IMPLANT
DEFOGGER ANTIFOG KIT (MISCELLANEOUS) ×3 IMPLANT
DEFOGGER SCOPE WARMER CLEARIFY (MISCELLANEOUS) ×3 IMPLANT
DERMABOND ADVANCED (GAUZE/BANDAGES/DRESSINGS) ×2
DERMABOND ADVANCED .7 DNX12 (GAUZE/BANDAGES/DRESSINGS) ×1 IMPLANT
DRAPE LAPAROSCOPIC ABDOMINAL (DRAPES) ×3 IMPLANT
ELECT REM PT RETURN 9FT ADLT (ELECTROSURGICAL) ×3
ELECTRODE REM PT RTRN 9FT ADLT (ELECTROSURGICAL) ×1 IMPLANT
GAUZE 4X4 16PLY RFD (DISPOSABLE) ×3 IMPLANT
GAUZE SPONGE 2X2 8PLY STRL LF (GAUZE/BANDAGES/DRESSINGS) ×1 IMPLANT
GLOVE BIO SURGEON STRL SZ7.5 (GLOVE) ×3 IMPLANT
GOWN STRL REUS W/ TWL LRG LVL3 (GOWN DISPOSABLE) ×2 IMPLANT
GOWN STRL REUS W/ TWL XL LVL3 (GOWN DISPOSABLE) ×1 IMPLANT
GOWN STRL REUS W/TWL LRG LVL3 (GOWN DISPOSABLE) ×4
GOWN STRL REUS W/TWL XL LVL3 (GOWN DISPOSABLE) ×2
KIT BASIN OR (CUSTOM PROCEDURE TRAY) ×3 IMPLANT
KIT TURNOVER KIT B (KITS) ×3 IMPLANT
LEGGING LITHOTOMY PAIR STRL (DRAPES) IMPLANT
NEEDLE INSUFFLATION 14GA 120MM (NEEDLE) ×3 IMPLANT
NS IRRIG 1000ML POUR BTL (IV SOLUTION) ×3 IMPLANT
PACK GENERAL/GYN (CUSTOM PROCEDURE TRAY) ×3 IMPLANT
PAD ARMBOARD 7.5X6 YLW CONV (MISCELLANEOUS) ×6 IMPLANT
PLUG CATH AND CAP STER (CATHETERS) ×3 IMPLANT
SCISSORS LAP 5X35 DISP (ENDOMECHANICALS) ×3 IMPLANT
SET IRRIG TUBING LAPAROSCOPIC (IRRIGATION / IRRIGATOR) IMPLANT
SET TUBE SMOKE EVAC HIGH FLOW (TUBING) ×3 IMPLANT
SLEEVE ENDOPATH XCEL 5M (ENDOMECHANICALS) ×12 IMPLANT
SPONGE GAUZE 2X2 STER 10/PKG (GAUZE/BANDAGES/DRESSINGS) ×2
SUT ETHILON 2 0 FS 18 (SUTURE) ×6 IMPLANT
SUT MNCRL AB 3-0 PS2 18 (SUTURE) ×3 IMPLANT
SUT SILK 2 0 SH (SUTURE) ×6 IMPLANT
SYR 20ML LL LF (SYRINGE) ×3 IMPLANT
SYR TOOMEY 50ML (SYRINGE) ×3 IMPLANT
TOWEL GREEN STERILE (TOWEL DISPOSABLE) ×3 IMPLANT
TOWEL GREEN STERILE FF (TOWEL DISPOSABLE) ×3 IMPLANT
TRAY LAPAROSCOPIC MC (CUSTOM PROCEDURE TRAY) IMPLANT
TROCAR XCEL NON-BLD 11X100MML (ENDOMECHANICALS) IMPLANT
TROCAR XCEL NON-BLD 5MMX100MML (ENDOMECHANICALS) ×3 IMPLANT
TUBE MOSS GAS 18FR (TUBING) ×3 IMPLANT
TUBE TRACH 6 EXL DIST CUF (TUBING) ×3 IMPLANT
WATER STERILE IRR 1000ML POUR (IV SOLUTION) ×3 IMPLANT

## 2019-08-27 NOTE — Anesthesia Preprocedure Evaluation (Addendum)
Anesthesia Evaluation  Patient identified by MRN, date of birth, ID band Patient awake    Reviewed: Allergy & Precautions, NPO status , Patient's Chart, lab work & pertinent test results  History of Anesthesia Complications Negative for: history of anesthetic complications  Airway Mallampati: Trach       Dental   Pulmonary pneumonia, COPD (severe),  COPD inhaler,   Acute on chronic respiratory failure, tracheostomy in place since 07/25/19    Tracheostomy in place    + decreased breath sounds      Cardiovascular hypertension, Pt. on medications Normal cardiovascular exam  TTE 08/21/19: EF 55-60%, mild LVH, grade I diastolic dysfunction, moderate LAE, mild MR, mild TR     Neuro/Psych  Headaches, Seizures -,  Metabolic encephalopathy negative psych ROS   GI/Hepatic Neg liver ROS, GERD  Medicated,  Endo/Other  diabetes, Type 2, Insulin DependentHypothyroidism Morbid obesity  Renal/GU negative Renal ROS     Musculoskeletal  (+) Arthritis , Fibromyalgia -, narcotic dependent  Abdominal (+) + obese,   Peds  Hematology negative hematology ROS (+)   Anesthesia Other Findings   Reproductive/Obstetrics                         Anesthesia Physical Anesthesia Plan  ASA: IV  Anesthesia Plan: General   Post-op Pain Management:    Induction: Inhalational  PONV Risk Score and Plan: 3 and Treatment may vary due to age or medical condition, Ondansetron and Dexamethasone  Airway Management Planned: Tracheostomy  Additional Equipment: None  Intra-op Plan:   Post-operative Plan: Post-operative intubation/ventilation  Informed Consent: I have reviewed the patients History and Physical, chart, labs and discussed the procedure including the risks, benefits and alternatives for the proposed anesthesia with the patient or authorized representative who has indicated his/her understanding and acceptance.        Plan Discussed with: CRNA and Anesthesiologist  Anesthesia Plan Comments:       Anesthesia Quick Evaluation

## 2019-08-27 NOTE — Op Note (Signed)
Preop diagnosis: Need for enteral feeding access Postop diagnosis: Same Procedure performed: Laparoscopic lysis of adhesions, laparoscopic gastrostomy tube placement Surgeon:Aarilyn Dye K Torianne Laflam Assistant:  Dr. Ralene Ok Anesthesia: General Indications: This is a 73 year old female who [facility]  She has a trach in place.  She has dysphagia that precludes oral intake.  She has been fed through a nasoenteric tube.  We are asked to place a feeding gastrostomy.  Description of procedure: The patient is brought to the operating room placed in supine position on the operating room table.  After an adequate level of general anesthesia was obtained, her abdomen was prepped with ChloraPrep and draped sterile fashion.  The patient has had several intra-abdominal surgeries.  1 of these was a laparoscopic cholecystectomy.  Using a 5 mm Optiview trocar, we cannulated the peritoneal cavity in the right upper quadrant.  We insufflated CO2 maintaining a maximum pressure of 15 mmHg.  The patient has significant omental adhesions to the anterior abdominal wall.  Another 5 mm port was placed in the right side at the level of the umbilicus.  Using cautery scissors we began taking down the omental adhesions.  We had to place a third port just above the umbilicus.  Were able to take down all the adhesions to the left of the falciform ligament.  The liver is quite large and hangs down about to the level of the umbilicus.  We inserted a liver retractor to lift the left lobe of the liver.  We were then able to identify what appeared to be the stomach.  We were able to pull this down below the edge of the liver.  The descending colon is quite distended with gas.  The transverse colon is also identified.  We were able to bring the visualized portion of the stomach up to the anterior abdominal wall.  A cruciate incision was made here in the left upper quadrant 2 fingerbreadths below the costal margin.   An 60 French Moss gastrostomy tube was inserted through the incision.  We made a gastrotomy using cautery.  The gastrostomy tube was inserted through the gastrotomy and advanced distally.  The balloon was inflated and was used to oppose the stomach wall up to the anterior abdominal wall.  The tube was secured with multiple interrupted 2-0 Ethilon sutures.  Bile was seen coming from the gastrostomy tube.  The tube flushed easily and we were able to aspirate saline and bile.  The tube was placed to straight drain.  We released our insufflation under direct vision.  The stomach remained opposed to the anterior abdominal wall.  The ports were removed.  The incisions were closed with 4-0 Monocryl and sealed with Dermabond.  The patient was extubated and brought to the recovery room in stable condition.  All sponge, instrument, and needle counts are correct.  Imogene Burn. Georgette Dover, MD, [facility] Surgery  General/ Trauma Surgery   08/27/2019 2:42 PM

## 2019-08-27 NOTE — Transfer of Care (Signed)
Immediate Anesthesia Transfer of Care Note  Patient: Latoya Harris  Procedure(s) Performed: LAPAROSCOPIC INSERTION GASTROSTOMY TUBE (N/A Abdomen)  Patient Location: PACU  Anesthesia Type:General  Level of Consciousness: drowsy and patient cooperative  Airway & Oxygen Therapy: Patient Spontanous Breathing and Patient connected to T-piece oxygen  Post-op Assessment: Report given to RN  Post vital signs: Reviewed and stable  Last Vitals:  Vitals Value Taken Time  BP 115/86 08/27/19 1445  Temp    Pulse 68 08/27/19 1445  Resp 16 08/27/19 1445  SpO2 92 % 08/27/19 1445  Vitals shown include unvalidated device data.  Last Pain: There were no vitals filed for this visit.       Complications: No apparent anesthesia complications

## 2019-08-27 NOTE — Progress Notes (Signed)
Pulmonary Critical Care Medicine [place]   PULMONARY CRITICAL CARE SERVICE  PROGRESS NOTE  Date of Service: 08/27/2019  [name]  [id number]  DOB: [birth year]   DOA: 08/20/2019  Referring Physician: Merton Border, MD  HPI: Latoya Harris is a 73 y.o. female seen for follow up of Acute on Chronic Respiratory Failure.  Patient is T collar right now is on 28% FiO2 comfortable without any distress.  Medications: Reviewed on Rounds  Physical Exam:  Vitals: Temperature 97.4 pulse 61 respiratory rate 18 blood pressure 142/73 saturations 98%  Ventilator Settings off the ventilator right now on T collar  . General: Comfortable at this time . Eyes: Grossly normal lids, irises & conjunctiva . ENT: grossly tongue is normal . Neck: no obvious mass . Cardiovascular: S1 S2 normal no gallop . Respiratory: No rhonchi no rales are noted at this time . Abdomen: soft . Skin: no rash seen on limited exam . Musculoskeletal: not rigid . Psychiatric:unable to assess . Neurologic: no seizure no involuntary movements         Lab Data:   Basic Metabolic Panel: Recent Labs  Lab 08/23/19 0657 08/24/19 0542 08/24/19 1052 08/26/19 0701  NA 140  --  140 144  K 3.4* 5.1 3.7 3.7  CL 98  --  98 100  CO2 32  --  29 34*  GLUCOSE 130*  --  147* 111*  BUN 19  --  17 17  CREATININE 0.77  --  0.89 0.89  CALCIUM 8.9  --  9.0 9.3  MG 2.3  --   --  2.3    ABG: No results for input(s): PHART, PCO2ART, PO2ART, HCO3, O2SAT in the last 168 hours.  Liver Function Tests: No results for input(s): AST, ALT, ALKPHOS, BILITOT, PROT, ALBUMIN in the last 168 hours. No results for input(s): LIPASE, AMYLASE in the last 168 hours. No results for input(s): AMMONIA in the last 168 hours.  CBC: Recent Labs  Lab 08/23/19 0657 08/26/19 0701  WBC 5.8 6.5  HGB 11.1* 11.4*  HCT 36.1 37.6  MCV 92.1 94.2  PLT 209 222    Cardiac Enzymes: No results for input(s): CKTOTAL, CKMB,  CKMBINDEX, TROPONINI in the last 168 hours.  BNP (last 3 results) No results for input(s): BNP in the last 8760 hours.  ProBNP (last 3 results) No results for input(s): PROBNP in the last 8760 hours.  Radiological Exams: No results found.  Assessment/Plan Active Problems:   Acute on chronic respiratory failure with hypoxia (HCC)   COPD, severe (HCC)   Lobar pneumonia, unspecified organism (HCC)   Acute metabolic encephalopathy   Chronic pain syndrome   1. Acute on chronic respiratory failure hypoxia plan is to continue with T collar trials patient is on 20% FiO2 seems to be tolerating it well so far 2. Severe COPD at baseline nebulizers as necessary 3. Lobar pneumonia treated clinically improving 4. Metabolic encephalopathy slow to improve 5. Chronic pain syndrome controlled   I have personally seen and evaluated the patient, evaluated laboratory and imaging results, formulated the assessment and plan and placed orders. The Patient requires high complexity decision making for assessment and support.  Case was discussed on Rounds with the Respiratory Therapy Staff  Allyne Gee, MD [facility] Pulmonary Critical Care Medicine Sleep Medicine

## 2019-08-27 NOTE — Anesthesia Procedure Notes (Signed)
Date/Time: 08/27/2019 1:03 PM Performed by: Wilburn Cornelia, CRNA Pre-anesthesia Checklist: Patient identified, Emergency Drugs available, Suction available, Patient being monitored and Timeout performed Patient Re-evaluated:Patient Re-evaluated prior to induction Oxygen Delivery Method: Circle system utilized Preoxygenation: Pre-oxygenation with 100% oxygen Induction Type: Inhalational induction Tube size: 6.0 mm Airway Equipment and Method: Tracheostomy Placement Confirmation: positive ETCO2,  CO2 detector and breath sounds checked- equal and bilateral Dental Injury: Teeth and Oropharynx as per pre-operative assessment  Comments: 6.0 XL trach changed to 6.0 XL trach with cuff for laparscopic procedure.

## 2019-08-27 NOTE — Anesthesia Postprocedure Evaluation (Signed)
Anesthesia Post Note  Patient: Latoya Harris  Procedure(s) Performed: LAPAROSCOPIC INSERTION GASTROSTOMY TUBE (N/A Abdomen)     Patient location during evaluation: PACU Anesthesia Type: General Level of consciousness: awake and alert Pain management: pain level controlled Vital Signs Assessment: post-procedure vital signs reviewed and stable Respiratory status: spontaneous breathing, nonlabored ventilation, respiratory function stable and patient connected to nasal cannula oxygen Cardiovascular status: blood pressure returned to baseline and stable Postop Assessment: no apparent nausea or vomiting Anesthetic complications: no    Last Vitals:  Vitals:   08/27/19 1445 08/27/19 1501  BP: 115/86 101/77  Pulse: 69 66  Resp: 12 15  Temp: 36.5 C   SpO2: 96% 97%    Last Pain:  Vitals:   08/27/19 1445  PainSc: 0-No pain                 Mariel Gaudin DAVID

## 2019-08-28 ENCOUNTER — Encounter (HOSPITAL_COMMUNITY): Payer: Self-pay | Admitting: Surgery

## 2019-08-28 DIAGNOSIS — G9341 Metabolic encephalopathy: Secondary | ICD-10-CM | POA: Diagnosis not present

## 2019-08-28 DIAGNOSIS — J9621 Acute and chronic respiratory failure with hypoxia: Secondary | ICD-10-CM | POA: Diagnosis not present

## 2019-08-28 DIAGNOSIS — J449 Chronic obstructive pulmonary disease, unspecified: Secondary | ICD-10-CM | POA: Diagnosis not present

## 2019-08-28 DIAGNOSIS — G894 Chronic pain syndrome: Secondary | ICD-10-CM | POA: Diagnosis not present

## 2019-08-28 LAB — CBC
HCT: 36.9 % (ref 36.0–46.0)
Hemoglobin: 11.1 g/dL — ABNORMAL LOW (ref 12.0–15.0)
MCH: 28.1 pg (ref 26.0–34.0)
MCHC: 30.1 g/dL (ref 30.0–36.0)
MCV: 93.4 fL (ref 80.0–100.0)
Platelets: 225 10*3/uL (ref 150–400)
RBC: 3.95 MIL/uL (ref 3.87–5.11)
RDW: 16.5 % — ABNORMAL HIGH (ref 11.5–15.5)
WBC: 9.7 10*3/uL (ref 4.0–10.5)
nRBC: 0 % (ref 0.0–0.2)

## 2019-08-28 LAB — BASIC METABOLIC PANEL
Anion gap: 13 (ref 5–15)
BUN: 19 mg/dL (ref 8–23)
CO2: 29 mmol/L (ref 22–32)
Calcium: 8.9 mg/dL (ref 8.9–10.3)
Chloride: 101 mmol/L (ref 98–111)
Creatinine, Ser: 1.03 mg/dL — ABNORMAL HIGH (ref 0.44–1.00)
GFR calc Af Amer: >60 mL/min (ref >60)
GFR calc non Af Amer: 54 mL/min — ABNORMAL LOW (ref >60)
Glucose, Bld: 126 mg/dL — ABNORMAL HIGH (ref 70–99)
Potassium: 4.7 mmol/L (ref 3.5–5.1)
Sodium: 143 mmol/L (ref 135–145)

## 2019-08-28 LAB — URINALYSIS, ROUTINE W REFLEX MICROSCOPIC
Bilirubin Urine: NEGATIVE
Glucose, UA: NEGATIVE mg/dL
Ketones, ur: 5 mg/dL — AB
Nitrite: NEGATIVE
Protein, ur: 100 mg/dL — AB
Specific Gravity, Urine: 1.024 (ref 1.005–1.030)
WBC, UA: >50 WBC/hpf — ABNORMAL HIGH (ref 0–5)
pH: 5 (ref 5.0–8.0)

## 2019-08-28 LAB — PHOSPHORUS: Phosphorus: 3.7 mg/dL (ref 2.5–4.6)

## 2019-08-28 LAB — MAGNESIUM: Magnesium: 2.2 mg/dL (ref 1.7–2.4)

## 2019-08-28 NOTE — Progress Notes (Signed)
Pulmonary Critical Care Medicine [place]   PULMONARY CRITICAL CARE SERVICE  PROGRESS NOTE  Date of Service: 08/28/2019  Latoya Harris  [id number]  DOB: [birth year]   DOA: 08/20/2019  Referring Physician: Merton Border, MD  HPI: Latoya Harris is a 73 y.o. female seen for follow up of Acute on Chronic Respiratory Failure.  Patient is status post PEG tube placement remains on the T collar comfortable  Medications: Reviewed on Rounds  Physical Exam:  Vitals: Temperature 98.4 pulse 80 respiratory 20 blood pressure 149/74 saturations 98%  Ventilator Settings off the ventilator right now on T collar currently on 20% FiO2  . General: Comfortable at this time . Eyes: Grossly normal lids, irises & conjunctiva . ENT: grossly tongue is normal . Neck: no obvious mass . Cardiovascular: S1 S2 normal no gallop . Respiratory: No rhonchi no rales are noted at this time . Abdomen: soft . Skin: no rash seen on limited exam . Musculoskeletal: not rigid . Psychiatric:unable to assess . Neurologic: no seizure no involuntary movements         Lab Data:   Basic Metabolic Panel: Recent Labs  Lab 08/23/19 0657 08/24/19 0542 08/24/19 1052 08/26/19 0701 08/27/19 1923 08/28/19 0428  NA 140  --  140 144 140 143  K 3.4* 5.1 3.7 3.7 4.5 4.7  CL 98  --  98 100 100 101  CO2 32  --  29 34* 28 29  GLUCOSE 130*  --  147* 111* 158* 126*  BUN 19  --  17 17 19 19   CREATININE 0.77  --  0.89 0.89 1.04* 1.03*  CALCIUM 8.9  --  9.0 9.3 8.8* 8.9  MG 2.3  --   --  2.3 2.1 2.2  PHOS  --   --   --   --  4.4 3.7    ABG: No results for input(s): PHART, PCO2ART, PO2ART, HCO3, O2SAT in the last 168 hours.  Liver Function Tests: No results for input(s): AST, ALT, ALKPHOS, BILITOT, PROT, ALBUMIN in the last 168 hours. No results for input(s): LIPASE, AMYLASE in the last 168 hours. No results for input(s): AMMONIA in the last 168 hours.  CBC: Recent Labs  Lab 08/23/19 0657  08/26/19 0701 08/27/19 1923 08/28/19 0428  WBC 5.8 6.5 12.2* 9.7  HGB 11.1* 11.4* 11.9* 11.1*  HCT 36.1 37.6 38.6 36.9  MCV 92.1 94.2 92.8 93.4  PLT 209 222 220 225    Cardiac Enzymes: No results for input(s): CKTOTAL, CKMB, CKMBINDEX, TROPONINI in the last 168 hours.  BNP (last 3 results) No results for input(s): BNP in the last 8760 hours.  ProBNP (last 3 results) No results for input(s): PROBNP in the last 8760 hours.  Radiological Exams: Dg Chest Port 1 View  Result Date: 08/27/2019 CLINICAL DATA:  Chronic respiratory failure and hypoxia EXAM: PORTABLE CHEST 1 VIEW COMPARISON:  08/14/2019 FINDINGS: Tracheostomy tube is again seen. Previously noted feeding catheter is been removed. Cardiac shadow is enlarged but stable. The lungs are well aerated bilaterally. Mild left basilar atelectasis is noted similar to that seen on the prior exam. No focal confluent infiltrate is noted. Postsurgical changes in the left axilla are seen. No bony abnormality is noted. IMPRESSION: Stable atelectasis in the left base.  No acute abnormality noted. Electronically Signed   By: Inez Catalina M.D.   On: 08/27/2019 19:01    Assessment/Plan Active Problems:   Acute on chronic respiratory failure with hypoxia (HCC)   COPD, severe ([place])  Lobar pneumonia, unspecified organism ([place])   Acute metabolic encephalopathy   Chronic pain syndrome   1. Acute on chronic respiratory failure with hypoxia we will continue with T collar trials change the trach over back to a cuffless #6 2. Severe COPD at baseline we will continue with present management 3. Lobar pneumonia treated 4. Metabolic encephalopathy no change 5. Chronic pain controlled we will continue to monitor   I have personally seen and evaluated the patient, evaluated laboratory and imaging results, formulated the assessment and plan and placed orders. The Patient requires high complexity decision making for assessment and support.  Case was  discussed on Rounds with the Respiratory Therapy Staff  Allyne Gee, MD [facility] Pulmonary Critical Care Medicine Sleep Medicine

## 2019-08-29 DIAGNOSIS — J9621 Acute and chronic respiratory failure with hypoxia: Secondary | ICD-10-CM | POA: Diagnosis not present

## 2019-08-29 DIAGNOSIS — G9341 Metabolic encephalopathy: Secondary | ICD-10-CM | POA: Diagnosis not present

## 2019-08-29 DIAGNOSIS — J449 Chronic obstructive pulmonary disease, unspecified: Secondary | ICD-10-CM | POA: Diagnosis not present

## 2019-08-29 DIAGNOSIS — G894 Chronic pain syndrome: Secondary | ICD-10-CM | POA: Diagnosis not present

## 2019-08-29 NOTE — Progress Notes (Signed)
Pulmonary Critical Care Medicine [place]   PULMONARY CRITICAL CARE SERVICE  PROGRESS NOTE  Date of Service: 08/29/2019  Latoya Harris  [id number]  DOB: 1945/12/20   DOA: 08/20/2019  Referring Physician: Merton Border, MD  HPI: Latoya Harris is a 73 y.o. female seen for follow up of Acute on Chronic Respiratory Failure.  Patient is currently on T collar without any distress has been on 28% FiO2 good saturations noted  Medications: Reviewed on Rounds  Physical Exam:  Vitals: Temperature 98.0 pulse 75 respiratory 26 blood pressure 129/69 saturations 99%  Ventilator Settings off the ventilator on T collar with an FiO2 of 28%  . General: Comfortable at this time . Eyes: Grossly normal lids, irises & conjunctiva . ENT: grossly tongue is normal . Neck: no obvious mass . Cardiovascular: S1 S2 normal no gallop . Respiratory: No rhonchi no rales are noted at this time . Abdomen: soft . Skin: no rash seen on limited exam . Musculoskeletal: not rigid . Psychiatric:unable to assess . Neurologic: no seizure no involuntary movements         Lab Data:   Basic Metabolic Panel: Recent Labs  Lab 08/23/19 0657 08/24/19 0542 08/24/19 1052 08/26/19 0701 08/27/19 1923 08/28/19 0428  NA 140  --  140 144 140 143  K 3.4* 5.1 3.7 3.7 4.5 4.7  CL 98  --  98 100 100 101  CO2 32  --  29 34* 28 29  GLUCOSE 130*  --  147* 111* 158* 126*  BUN 19  --  17 17 19 19   CREATININE 0.77  --  0.89 0.89 1.04* 1.03*  CALCIUM 8.9  --  9.0 9.3 8.8* 8.9  MG 2.3  --   --  2.3 2.1 2.2  PHOS  --   --   --   --  4.4 3.7    ABG: No results for input(s): PHART, PCO2ART, PO2ART, HCO3, O2SAT in the last 168 hours.  Liver Function Tests: No results for input(s): AST, ALT, ALKPHOS, BILITOT, PROT, ALBUMIN in the last 168 hours. No results for input(s): LIPASE, AMYLASE in the last 168 hours. No results for input(s): AMMONIA in the last 168 hours.  CBC: Recent Labs  Lab  08/23/19 0657 08/26/19 0701 08/27/19 1923 08/28/19 0428  WBC 5.8 6.5 12.2* 9.7  HGB 11.1* 11.4* 11.9* 11.1*  HCT 36.1 37.6 38.6 36.9  MCV 92.1 94.2 92.8 93.4  PLT 209 222 220 225    Cardiac Enzymes: No results for input(s): CKTOTAL, CKMB, CKMBINDEX, TROPONINI in the last 168 hours.  BNP (last 3 results) No results for input(s): BNP in the last 8760 hours.  ProBNP (last 3 results) No results for input(s): PROBNP in the last 8760 hours.  Radiological Exams: DG CHEST PORT 1 VIEW  Result Date: 08/27/2019 CLINICAL DATA:  Chronic respiratory failure and hypoxia EXAM: PORTABLE CHEST 1 VIEW COMPARISON:  08/14/2019 FINDINGS: Tracheostomy tube is again seen. Previously noted feeding catheter is been removed. Cardiac shadow is enlarged but stable. The lungs are well aerated bilaterally. Mild left basilar atelectasis is noted similar to that seen on the prior exam. No focal confluent infiltrate is noted. Postsurgical changes in the left axilla are seen. No bony abnormality is noted. IMPRESSION: Stable atelectasis in the left base.  No acute abnormality noted. Electronically Signed   By: Inez Catalina M.D.   On: 08/27/2019 19:01    Assessment/Plan Active Problems:   Acute on chronic respiratory failure with hypoxia (HCC)  COPD, severe ([place])   Lobar pneumonia, unspecified organism ([place])   Acute metabolic encephalopathy   Chronic pain syndrome   1. Acute on chronic respiratory failure hypoxia plan is to continue with weaning on T collar patient has not been a candidate for capping or decannulation. 2. Severe COPD continue with supportive care nebulizers as necessary 3. Lobar pneumonia treated last chest x-ray still showing some atelectasis needs ongoing pulmonary toilet 4. Metabolic encephalopathy improved 5. Chronic pain syndrome controlled   I have personally seen and evaluated the patient, evaluated laboratory and imaging results, formulated the assessment and plan and placed  orders. The Patient requires high complexity decision making for assessment and support.  Case was discussed on Rounds with the Respiratory Therapy Staff  Allyne Gee, MD [facility] Pulmonary Critical Care Medicine Sleep Medicine

## 2019-08-30 DIAGNOSIS — J449 Chronic obstructive pulmonary disease, unspecified: Secondary | ICD-10-CM | POA: Diagnosis not present

## 2019-08-30 DIAGNOSIS — G9341 Metabolic encephalopathy: Secondary | ICD-10-CM | POA: Diagnosis not present

## 2019-08-30 DIAGNOSIS — G894 Chronic pain syndrome: Secondary | ICD-10-CM | POA: Diagnosis not present

## 2019-08-30 DIAGNOSIS — J9621 Acute and chronic respiratory failure with hypoxia: Secondary | ICD-10-CM | POA: Diagnosis not present

## 2019-08-30 LAB — BASIC METABOLIC PANEL
Anion gap: 12 (ref 5–15)
BUN: 20 mg/dL (ref 8–23)
CO2: 30 mmol/L (ref 22–32)
Calcium: 8.7 mg/dL — ABNORMAL LOW (ref 8.9–10.3)
Chloride: 100 mmol/L (ref 98–111)
Creatinine, Ser: 0.84 mg/dL (ref 0.44–1.00)
GFR calc Af Amer: >60 mL/min (ref >60)
GFR calc non Af Amer: >60 mL/min (ref >60)
Glucose, Bld: 125 mg/dL — ABNORMAL HIGH (ref 70–99)
Potassium: 3.8 mmol/L (ref 3.5–5.1)
Sodium: 142 mmol/L (ref 135–145)

## 2019-08-30 LAB — CBC
HCT: 33.5 % — ABNORMAL LOW (ref 36.0–46.0)
Hemoglobin: 10.3 g/dL — ABNORMAL LOW (ref 12.0–15.0)
MCH: 28.6 pg (ref 26.0–34.0)
MCHC: 30.7 g/dL (ref 30.0–36.0)
MCV: 93.1 fL (ref 80.0–100.0)
Platelets: 182 10*3/uL (ref 150–400)
RBC: 3.6 MIL/uL — ABNORMAL LOW (ref 3.87–5.11)
RDW: 16.2 % — ABNORMAL HIGH (ref 11.5–15.5)
WBC: 7.3 10*3/uL (ref 4.0–10.5)
nRBC: 0 % (ref 0.0–0.2)

## 2019-08-30 LAB — URINE CULTURE: Culture: 30000 — AB

## 2019-08-30 LAB — PHOSPHORUS: Phosphorus: 3.6 mg/dL (ref 2.5–4.6)

## 2019-08-30 LAB — MAGNESIUM: Magnesium: 2.3 mg/dL (ref 1.7–2.4)

## 2019-08-30 NOTE — Progress Notes (Signed)
Pulmonary Critical Care Medicine [place]   PULMONARY CRITICAL CARE SERVICE  PROGRESS NOTE  Date of Service: 08/30/2019  Latoya Harris  [id number]  DOB: 09-06-1946   DOA: 08/20/2019  Referring Physician: Merton Border, MD  HPI: Latoya Harris is a 73 y.o. female seen for follow up of Acute on Chronic Respiratory Failure.  Patient currently is on T collar has been on 28% FiO2 using the PMV  Medications: Reviewed on Rounds  Physical Exam:  Vitals: Temperature is 96.5 pulse 61 respiratory 11 blood pressure is 111/54 saturations 98%  Ventilator Settings on T collar with an FiO2 of 28% using PMV  . General: Comfortable at this time . Eyes: Grossly normal lids, irises & conjunctiva . ENT: grossly tongue is normal . Neck: no obvious mass . Cardiovascular: S1 S2 normal no gallop . Respiratory: No rhonchi no rales are noted at this time . Abdomen: soft . Skin: no rash seen on limited exam . Musculoskeletal: not rigid . Psychiatric:unable to assess . Neurologic: no seizure no involuntary movements         Lab Data:   Basic Metabolic Panel: Recent Labs  Lab 08/24/19 1052 08/26/19 0701 08/27/19 1923 08/28/19 0428 08/30/19 0420  NA 140 144 140 143 142  K 3.7 3.7 4.5 4.7 3.8  CL 98 100 100 101 100  CO2 29 34* 28 29 30   GLUCOSE 147* 111* 158* 126* 125*  BUN 17 17 19 19 20   CREATININE 0.89 0.89 1.04* 1.03* 0.84  CALCIUM 9.0 9.3 8.8* 8.9 8.7*  MG  --  2.3 2.1 2.2 2.3  PHOS  --   --  4.4 3.7 3.6    ABG: No results for input(s): PHART, PCO2ART, PO2ART, HCO3, O2SAT in the last 168 hours.  Liver Function Tests: No results for input(s): AST, ALT, ALKPHOS, BILITOT, PROT, ALBUMIN in the last 168 hours. No results for input(s): LIPASE, AMYLASE in the last 168 hours. No results for input(s): AMMONIA in the last 168 hours.  CBC: Recent Labs  Lab 08/26/19 0701 08/27/19 1923 08/28/19 0428 08/30/19 0420  WBC 6.5 12.2* 9.7 7.3  HGB 11.4* 11.9*  11.1* 10.3*  HCT 37.6 38.6 36.9 33.5*  MCV 94.2 92.8 93.4 93.1  PLT 222 220 225 182    Cardiac Enzymes: No results for input(s): CKTOTAL, CKMB, CKMBINDEX, TROPONINI in the last 168 hours.  BNP (last 3 results) No results for input(s): BNP in the last 8760 hours.  ProBNP (last 3 results) No results for input(s): PROBNP in the last 8760 hours.  Radiological Exams: No results found.  Assessment/Plan Active Problems:   Acute on chronic respiratory failure with hypoxia (HCC)   COPD, severe (HCC)   Lobar pneumonia, unspecified organism (HCC)   Acute metabolic encephalopathy   Chronic pain syndrome   1. Acute on chronic respiratory failure with hypoxia patient currently is on T collar with an FiO2 of 28% PMV is being used 2. Severe COPD at baseline we will continue with present management 3. Lobar pneumonia treated we will continue to follow 4. Acute metabolic encephalopathy no changes 5. Chronic pain syndrome controlled   I have personally seen and evaluated the patient, evaluated laboratory and imaging results, formulated the assessment and plan and placed orders. The Patient requires high complexity decision making for assessment and support.  Case was discussed on Rounds with the Respiratory Therapy Staff  Allyne Gee, MD [facility] Pulmonary Critical Care Medicine Sleep Medicine

## 2019-08-31 LAB — CULTURE, RESPIRATORY W GRAM STAIN

## 2019-08-31 NOTE — Progress Notes (Signed)
Pulmonary Critical Care Medicine [place]   PULMONARY CRITICAL CARE SERVICE  PROGRESS NOTE  Date of Service: 08/31/2019  Latoya Harris  [id number]  DOB: [birth year]   DOA: 08/20/2019  Referring Physician: Merton Border, MD  HPI: Latoya Harris is a 73 y.o. female seen for follow up of Acute on Chronic Respiratory Failure.  Patient currently is on T collar has been on 28% FiO2 using PMV  Medications: Reviewed on Rounds  Physical Exam:  Vitals: Temperature 97.4 pulse 54 respiratory 23 blood pressure 169/58 saturations 99%  Ventilator Settings off the ventilator on T collar right now on 28% FiO2 using the PMV has already met trend  . General: Comfortable at this time . Eyes: Grossly normal lids, irises & conjunctiva . ENT: grossly tongue is normal . Neck: no obvious mass . Cardiovascular: S1 S2 normal no gallop . Respiratory: No rhonchi no rales are noted at this time . Abdomen: soft . Skin: no rash seen on limited exam . Musculoskeletal: not rigid . Psychiatric:unable to assess . Neurologic: no seizure no involuntary movements         Lab Data:   Basic Metabolic Panel: Recent Labs  Lab 08/26/19 0701 08/27/19 1923 08/28/19 0428 08/30/19 0420  NA 144 140 143 142  K 3.7 4.5 4.7 3.8  CL 100 100 101 100  CO2 34* _0 GLUCOSE 111* 158* 126* 125*  BUN _1 CREATININE 0.89 1.04* 1.03* 0.84  CALCIUM 9.3 8.8* 8.9 8.7*  MG 2.3 2.1 2.2 2.3  PHOS  --  4.4 3.7 3.6    ABG: No results for input(s): PHART, PCO2ART, PO2ART, HCO3, O2SAT in the last 168 hours.  Liver Function Tests: No results for input(s): AST, ALT, ALKPHOS, BILITOT, PROT, ALBUMIN in the last 168 hours. No results for input(s): LIPASE, AMYLASE in the last 168 hours. No results for input(s): AMMONIA in the last 168 hours.  CBC: Recent Labs  Lab 08/26/19 0701 08/27/19 1923 08/28/19 0428 08/30/19 0420  WBC 6.5 12.2* 9.7 7.3  HGB 11.4* 11.9* 11.1* 10.3*  HCT 37.6  38.6 36.9 33.5*  MCV 94.2 92.8 93.4 93.1  PLT 222 220 225 182    Cardiac Enzymes: No results for input(s): CKTOTAL, CKMB, CKMBINDEX, TROPONINI in the last 168 hours.  BNP (last 3 results) No results for input(s): BNP in the last 8760 hours.  ProBNP (last 3 results) No results for input(s): PROBNP in the last 8760 hours.  Radiological Exams: No results found.  Assessment/Plan Active Problems:   Acute on chronic respiratory failure with hypoxia (HCC)   COPD, severe (HCC)   Lobar pneumonia, unspecified organism (HCC)   Acute metabolic encephalopathy   Chronic pain syndrome   1. Acute on chronic respiratory failure with hypoxia we will continue with T collar weaning and PMV we are going to continue to assess whether or not she is going to be able to advance to capping 2. Severe COPD currently is at baseline nebulizers as necessary 3. Lobar pneumonia treated we will continue with supportive care 4. Acute metabolic encephalopathy no change 5. Chronic pain syndrome we will continue with present management   I have personally seen and evaluated the patient, evaluated laboratory and imaging results, formulated the assessment and plan and placed orders. The Patient requires high complexity decision making for assessment and support.  Case was discussed on Rounds with the Respiratory Therapy Staff  Allyne Gee, MD [facility] Pulmonary Critical Care Medicine Sleep Medicine

## 2019-09-01 NOTE — Progress Notes (Signed)
Pulmonary Critical Care Medicine [place]   PULMONARY CRITICAL CARE SERVICE  PROGRESS NOTE  Date of Service: 09/01/2019  Latoya Harris  [id number]  DOB: Jun 10, 1946   DOA: 08/20/2019  Referring Physician: Merton Border, MD  HPI: Latoya Harris is a 73 y.o. female seen for follow up of Acute on Chronic Respiratory Failure.  Patient right now is on T collar has been on 28% FiO2 good saturations are noted at this time  Medications: Reviewed on Rounds  Physical Exam:  Vitals: Temperature 97.4 pulse 65 respiratory 19 blood pressure 123/63 saturations 100%  Ventilator Settings off the ventilator on T collar currently on 28% FiO2  . General: Comfortable at this time . Eyes: Grossly normal lids, irises & conjunctiva . ENT: grossly tongue is normal . Neck: no obvious mass . Cardiovascular: S1 S2 normal no gallop . Respiratory: No rhonchi coarse breath sounds are noted . Abdomen: soft . Skin: no rash seen on limited exam . Musculoskeletal: not rigid . Psychiatric:unable to assess . Neurologic: no seizure no involuntary movements         Lab Data:   Basic Metabolic Panel: Recent Labs  Lab 08/26/19 0701 08/27/19 1923 08/28/19 0428 08/30/19 0420  NA 144 140 143 142  K 3.7 4.5 4.7 3.8  CL 100 100 101 100  CO2 34* 28 29 30   GLUCOSE 111* 158* 126* 125*  BUN 17 19 19 20   CREATININE 0.89 1.04* 1.03* 0.84  CALCIUM 9.3 8.8* 8.9 8.7*  MG 2.3 2.1 2.2 2.3  PHOS  --  4.4 3.7 3.6    ABG: No results for input(s): PHART, PCO2ART, PO2ART, HCO3, O2SAT in the last 168 hours.  Liver Function Tests: No results for input(s): AST, ALT, ALKPHOS, BILITOT, PROT, ALBUMIN in the last 168 hours. No results for input(s): LIPASE, AMYLASE in the last 168 hours. No results for input(s): AMMONIA in the last 168 hours.  CBC: Recent Labs  Lab 08/26/19 0701 08/27/19 1923 08/28/19 0428 08/30/19 0420  WBC 6.5 12.2* 9.7 7.3  HGB 11.4* 11.9* 11.1* 10.3*  HCT 37.6 38.6  36.9 33.5*  MCV 94.2 92.8 93.4 93.1  PLT 222 220 225 182    Cardiac Enzymes: No results for input(s): CKTOTAL, CKMB, CKMBINDEX, TROPONINI in the last 168 hours.  BNP (last 3 results) No results for input(s): BNP in the last 8760 hours.  ProBNP (last 3 results) No results for input(s): PROBNP in the last 8760 hours.  Radiological Exams: No results found.  Assessment/Plan Active Problems:   Acute on chronic respiratory failure with hypoxia (HCC)   COPD, severe (HCC)   Lobar pneumonia, unspecified organism (HCC)   Acute metabolic encephalopathy   Chronic pain syndrome   1. Acute on chronic respiratory failure with hypoxia patient continues on T collar at this time she is requiring only 28% FiO2. 2. Severe COPD we will continue with present management 3. Lobar pneumonia treated we will continue to follow 4. Metabolic encephalopathy no change 5. Chronic pain control   I have personally seen and evaluated the patient, evaluated laboratory and imaging results, formulated the assessment and plan and placed orders. The Patient requires high complexity decision making for assessment and support.  Case was discussed on Rounds with the Respiratory Therapy Staff  Allyne Gee, MD [facility] Pulmonary Critical Care Medicine Sleep Medicine

## 2019-09-02 LAB — CBC
HCT: 34.2 % — ABNORMAL LOW (ref 36.0–46.0)
Hemoglobin: 10.6 g/dL — ABNORMAL LOW (ref 12.0–15.0)
MCH: 28.4 pg (ref 26.0–34.0)
MCHC: 31 g/dL (ref 30.0–36.0)
MCV: 91.7 fL (ref 80.0–100.0)
Platelets: 193 10*3/uL (ref 150–400)
RBC: 3.73 MIL/uL — ABNORMAL LOW (ref 3.87–5.11)
RDW: 16 % — ABNORMAL HIGH (ref 11.5–15.5)
WBC: 6.9 10*3/uL (ref 4.0–10.5)
nRBC: 0 % (ref 0.0–0.2)

## 2019-09-02 LAB — BASIC METABOLIC PANEL
Anion gap: 13 (ref 5–15)
BUN: 18 mg/dL (ref 8–23)
CO2: 26 mmol/L (ref 22–32)
Calcium: 8.4 mg/dL — ABNORMAL LOW (ref 8.9–10.3)
Chloride: 104 mmol/L (ref 98–111)
Creatinine, Ser: 0.92 mg/dL (ref 0.44–1.00)
GFR calc Af Amer: >60 mL/min (ref >60)
GFR calc non Af Amer: >60 mL/min (ref >60)
Glucose, Bld: 97 mg/dL (ref 70–99)
Potassium: 3.4 mmol/L — ABNORMAL LOW (ref 3.5–5.1)
Sodium: 143 mmol/L (ref 135–145)

## 2019-09-02 LAB — PHOSPHORUS: Phosphorus: 4.2 mg/dL (ref 2.5–4.6)

## 2019-09-02 LAB — MAGNESIUM: Magnesium: 2.2 mg/dL (ref 1.7–2.4)

## 2019-09-02 NOTE — Progress Notes (Signed)
Pulmonary Critical Care Medicine [place]   PULMONARY CRITICAL CARE SERVICE  PROGRESS NOTE  Date of Service: 09/02/2019  Latoya Harris  [id number]  DOB: 11-Sep-1946   DOA: 08/20/2019  Referring Physician: Merton Border, MD  HPI: Latoya Harris is a 73 y.o. female seen for follow up of Acute on Chronic Respiratory Failure.  Patient currently is on T collar has been with 28% FiO2 with good saturations.  Secretions have been fair to moderate.  Medications: Reviewed on Rounds  Physical Exam:  Vitals: Temperature is 97.6 pulse 50 respiratory 17 blood pressure 165/58 saturations 99%  Ventilator Settings off the ventilator on T collar currently on 20% FiO2  . General: Comfortable at this time . Eyes: Grossly normal lids, irises & conjunctiva . ENT: grossly tongue is normal . Neck: no obvious mass . Cardiovascular: S1 S2 normal no gallop . Respiratory: No rhonchi coarse breath sounds are noted . Abdomen: soft . Skin: no rash seen on limited exam . Musculoskeletal: not rigid . Psychiatric:unable to assess . Neurologic: no seizure no involuntary movements         Lab Data:   Basic Metabolic Panel: Recent Labs  Lab 08/27/19 1923 08/28/19 0428 08/30/19 0420 09/02/19 0752  NA 140 143 142 143  K 4.5 4.7 3.8 3.4*  CL 100 101 100 104  CO2 28 29 30 26   GLUCOSE 158* 126* 125* 97  BUN 19 19 20 18   CREATININE 1.04* 1.03* 0.84 0.92  CALCIUM 8.8* 8.9 8.7* 8.4*  MG 2.1 2.2 2.3 2.2  PHOS 4.4 3.7 3.6 4.2    ABG: No results for input(s): PHART, PCO2ART, PO2ART, HCO3, O2SAT in the last 168 hours.  Liver Function Tests: No results for input(s): AST, ALT, ALKPHOS, BILITOT, PROT, ALBUMIN in the last 168 hours. No results for input(s): LIPASE, AMYLASE in the last 168 hours. No results for input(s): AMMONIA in the last 168 hours.  CBC: Recent Labs  Lab 08/27/19 1923 08/28/19 0428 08/30/19 0420 09/02/19 0752  WBC 12.2* 9.7 7.3 6.9  HGB 11.9* 11.1*  10.3* 10.6*  HCT 38.6 36.9 33.5* 34.2*  MCV 92.8 93.4 93.1 91.7  PLT 220 225 182 193    Cardiac Enzymes: No results for input(s): CKTOTAL, CKMB, CKMBINDEX, TROPONINI in the last 168 hours.  BNP (last 3 results) No results for input(s): BNP in the last 8760 hours.  ProBNP (last 3 results) No results for input(s): PROBNP in the last 8760 hours.  Radiological Exams: No results found.  Assessment/Plan Active Problems:   Acute on chronic respiratory failure with hypoxia (HCC)   COPD, severe (HCC)   Lobar pneumonia, unspecified organism (HCC)   Acute metabolic encephalopathy   Chronic pain syndrome   1. Acute on chronic respiratory failure hypoxia patient currently is on T collar has been on 20% FiO2 good saturations are noted 2. Severe COPD at baseline we will continue present management 3. Lobar pneumonia treated clinically improved 4. Acute metabolic encephalopathy no changes are noted at this time 5. Chronic pain syndrome controlled   I have personally seen and evaluated the patient, evaluated laboratory and imaging results, formulated the assessment and plan and placed orders. The Patient requires high complexity decision making for assessment and support.  Case was discussed on Rounds with the Respiratory Therapy Staff  Allyne Gee, MD [facility] Pulmonary Critical Care Medicine Sleep Medicine

## 2019-09-03 DIAGNOSIS — G894 Chronic pain syndrome: Secondary | ICD-10-CM | POA: Diagnosis not present

## 2019-09-03 DIAGNOSIS — J9621 Acute and chronic respiratory failure with hypoxia: Secondary | ICD-10-CM | POA: Diagnosis not present

## 2019-09-03 DIAGNOSIS — G9341 Metabolic encephalopathy: Secondary | ICD-10-CM | POA: Diagnosis not present

## 2019-09-03 DIAGNOSIS — J449 Chronic obstructive pulmonary disease, unspecified: Secondary | ICD-10-CM | POA: Diagnosis not present

## 2019-09-03 LAB — SARS CORONAVIRUS 2 (TAT 6-24 HRS): SARS Coronavirus 2: NEGATIVE

## 2019-09-03 LAB — POTASSIUM: Potassium: 3.5 mmol/L (ref 3.5–5.1)

## 2019-09-03 NOTE — Progress Notes (Signed)
Pulmonary Critical Care Medicine [place]   PULMONARY CRITICAL CARE SERVICE  PROGRESS NOTE  Date of Service: 09/03/2019  Latoya Harris  [id number]  DOB: 06-29-1946   DOA: 08/20/2019  Referring Physician: Merton Border, MD  HPI: Latoya Harris is a 73 y.o. female seen for follow up of Acute on Chronic Respiratory Failure.  Patient is currently on T collar has been on 28% FiO2 good saturations are noted  Medications: Reviewed on Rounds  Physical Exam:  Vitals: Temperature 97.2 pulse 57 respiratory 20 blood pressure 143/74 saturations 99%  Ventilator Settings off the ventilator right now on T collar  . General: Comfortable at this time . Eyes: Grossly normal lids, irises & conjunctiva . ENT: grossly tongue is normal . Neck: no obvious mass . Cardiovascular: S1 S2 normal no gallop . Respiratory: No rhonchi no rales are noted at this time . Abdomen: soft . Skin: no rash seen on limited exam . Musculoskeletal: not rigid . Psychiatric:unable to assess . Neurologic: no seizure no involuntary movements         Lab Data:   Basic Metabolic Panel: Recent Labs  Lab 08/27/19 1923 08/28/19 0428 08/30/19 0420 09/02/19 0752 09/03/19 0857  NA 140 143 142 143  --   K 4.5 4.7 3.8 3.4* 3.5  CL 100 101 100 104  --   CO2 28 29 30 26   --   GLUCOSE 158* 126* 125* 97  --   BUN 19 19 20 18   --   CREATININE 1.04* 1.03* 0.84 0.92  --   CALCIUM 8.8* 8.9 8.7* 8.4*  --   MG 2.1 2.2 2.3 2.2  --   PHOS 4.4 3.7 3.6 4.2  --     ABG: No results for input(s): PHART, PCO2ART, PO2ART, HCO3, O2SAT in the last 168 hours.  Liver Function Tests: No results for input(s): AST, ALT, ALKPHOS, BILITOT, PROT, ALBUMIN in the last 168 hours. No results for input(s): LIPASE, AMYLASE in the last 168 hours. No results for input(s): AMMONIA in the last 168 hours.  CBC: Recent Labs  Lab 08/27/19 1923 08/28/19 0428 08/30/19 0420 09/02/19 0752  WBC 12.2* 9.7 7.3 6.9  HGB  11.9* 11.1* 10.3* 10.6*  HCT 38.6 36.9 33.5* 34.2*  MCV 92.8 93.4 93.1 91.7  PLT 220 225 182 193    Cardiac Enzymes: No results for input(s): CKTOTAL, CKMB, CKMBINDEX, TROPONINI in the last 168 hours.  BNP (last 3 results) No results for input(s): BNP in the last 8760 hours.  ProBNP (last 3 results) No results for input(s): PROBNP in the last 8760 hours.  Radiological Exams: No results found.  Assessment/Plan Active Problems:   Acute on chronic respiratory failure with hypoxia (HCC)   COPD, severe (HCC)   Lobar pneumonia, unspecified organism (HCC)   Acute metabolic encephalopathy   Chronic pain syndrome   1. Acute on chronic respiratory failure hypoxia she is at baseline on T collar requiring 28% FiO2 plan is to continue T collar as tolerated 2. Severe COPD no changes we will continue present management 3. Lobar pneumonia treated 4. Acute metabolic encephalopathy no change 5. Chronic pain controlled   I have personally seen and evaluated the patient, evaluated laboratory and imaging results, formulated the assessment and plan and placed orders. The Patient requires high complexity decision making for assessment and support.  Case was discussed on Rounds with the Respiratory Therapy Staff  Allyne Gee, MD [facility] Pulmonary Critical Care Medicine Sleep Medicine

## 2019-09-04 DIAGNOSIS — G894 Chronic pain syndrome: Secondary | ICD-10-CM | POA: Diagnosis not present

## 2019-09-04 DIAGNOSIS — G9341 Metabolic encephalopathy: Secondary | ICD-10-CM | POA: Diagnosis not present

## 2019-09-04 DIAGNOSIS — J9621 Acute and chronic respiratory failure with hypoxia: Secondary | ICD-10-CM | POA: Diagnosis not present

## 2019-09-04 DIAGNOSIS — J449 Chronic obstructive pulmonary disease, unspecified: Secondary | ICD-10-CM | POA: Diagnosis not present

## 2019-09-04 NOTE — Progress Notes (Signed)
Pulmonary Critical Care Medicine [place]   PULMONARY CRITICAL CARE SERVICE  PROGRESS NOTE  Date of Service: 09/04/2019  Latoya Harris  [id number]  DOB: 12-14-1945   DOA: 08/20/2019  Referring Physician: Merton Border, MD  HPI: Latoya Harris is a 73 y.o. female seen for follow up of Acute on Chronic Respiratory Failure.  Patient is on T collar currently on 28% FiO2 good saturations are noted  Medications: Reviewed on Rounds  Physical Exam:  Vitals: Temperature is 97.5 pulse 60 respiratory rate 20 blood pressure 118/47 saturations 97%  Ventilator Settings off the ventilator currently is on 28% T collar  . General: Comfortable at this time . Eyes: Grossly normal lids, irises & conjunctiva . ENT: grossly tongue is normal . Neck: no obvious mass . Cardiovascular: S1 S2 normal no gallop . Respiratory: No rhonchi no rales are noted at this time . Abdomen: soft . Skin: no rash seen on limited exam . Musculoskeletal: not rigid . Psychiatric:unable to assess . Neurologic: no seizure no involuntary movements         Lab Data:   Basic Metabolic Panel: Recent Labs  Lab 08/30/19 0420 09/02/19 0752 09/03/19 0857  NA 142 143  --   K 3.8 3.4* 3.5  CL 100 104  --   CO2 30 26  --   GLUCOSE 125* 97  --   BUN 20 18  --   CREATININE 0.84 0.92  --   CALCIUM 8.7* 8.4*  --   MG 2.3 2.2  --   PHOS 3.6 4.2  --     ABG: No results for input(s): PHART, PCO2ART, PO2ART, HCO3, O2SAT in the last 168 hours.  Liver Function Tests: No results for input(s): AST, ALT, ALKPHOS, BILITOT, PROT, ALBUMIN in the last 168 hours. No results for input(s): LIPASE, AMYLASE in the last 168 hours. No results for input(s): AMMONIA in the last 168 hours.  CBC: Recent Labs  Lab 08/30/19 0420 09/02/19 0752  WBC 7.3 6.9  HGB 10.3* 10.6*  HCT 33.5* 34.2*  MCV 93.1 91.7  PLT 182 193    Cardiac Enzymes: No results for input(s): CKTOTAL, CKMB, CKMBINDEX, TROPONINI in  the last 168 hours.  BNP (last 3 results) No results for input(s): BNP in the last 8760 hours.  ProBNP (last 3 results) No results for input(s): PROBNP in the last 8760 hours.  Radiological Exams: No results found.  Assessment/Plan Active Problems:   Acute on chronic respiratory failure with hypoxia (HCC)   COPD, severe (HCC)   Lobar pneumonia, unspecified organism (HCC)   Acute metabolic encephalopathy   Chronic pain syndrome   1. Acute on chronic respiratory failure hypoxia plan is to continue with T collar trials as tolerated titrate oxygen continue pulmonary toilet 2. Severe COPD at baseline we will continue present management 3. Lobar pneumonia treated we will continue with present management 4. Acute metabolic encephalopathy no changes noted 5. Chronic pain controlled   I have personally seen and evaluated the patient, evaluated laboratory and imaging results, formulated the assessment and plan and placed orders. The Patient requires high complexity decision making for assessment and support.  Case was discussed on Rounds with the Respiratory Therapy Staff  Allyne Gee, MD [facility] Pulmonary Critical Care Medicine Sleep Medicine

## 2021-04-08 IMAGING — DX DG ABDOMEN 1V
1 series · 1 of 1 positions shown · non-contrast
Comparison: None.

CLINICAL DATA: Feeding tube placement

EXAM:
ABDOMEN - 1 VIEW

[abdomen kub]
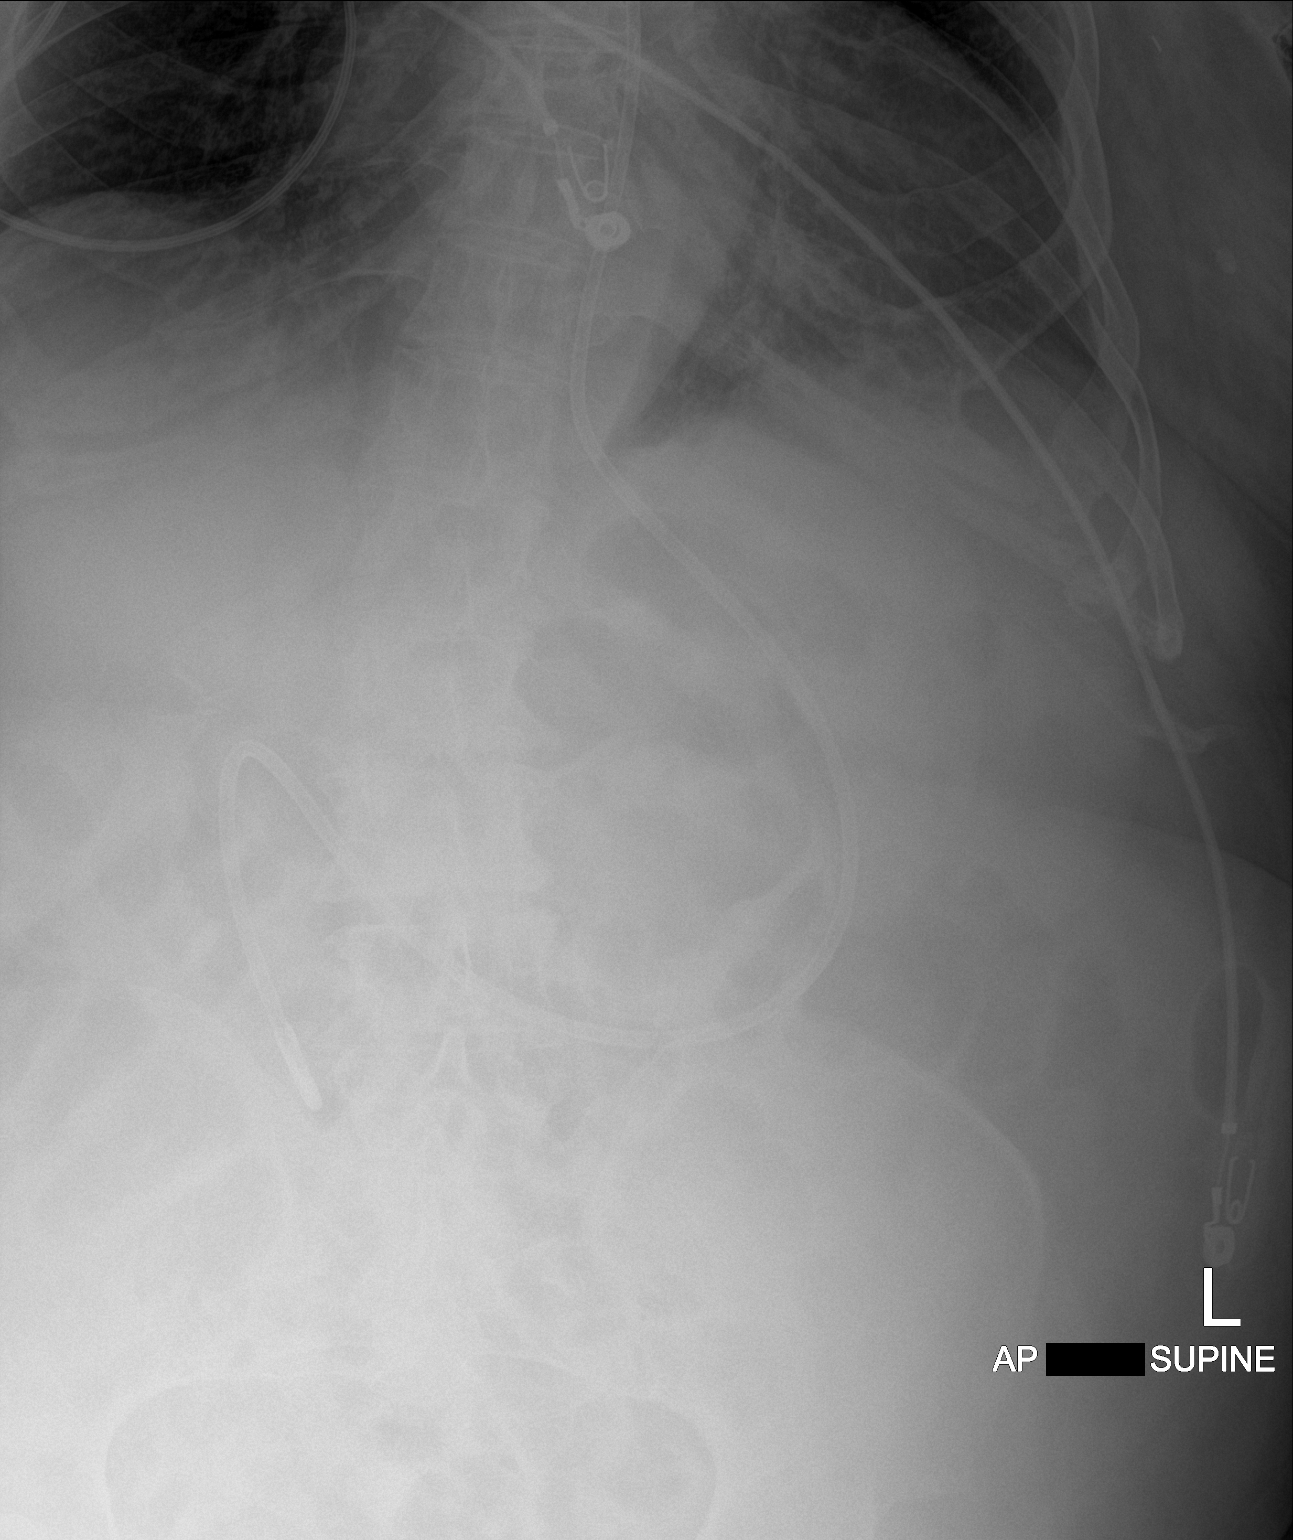

[1 of 1 positions shown; findings below may reference images not displayed]

FINDINGS: The tip of the feeding tube appears to be in the descending duodenum
based on curvature. No dilated bowel noted.
IMPRESSION: 1. Feeding tube tip: Descending duodenum.

## 2021-04-15 IMAGING — CT CT ABDOMEN W/O CM
2 of 4 series · 15 of 46 positions shown, 17 images · non-contrast
Comparison: None available

CLINICAL DATA: Dysphagia, preop planning for gastrostomy tube
placement

EXAM:
CT ABDOMEN WITHOUT CONTRAST
TECHNIQUE: Multidetector CT imaging of the abdomen was performed following the
standard protocol without IV contrast.

[Series 3: a/p w/o 5mm · axial · non-contrast · 0.98mm/px · z∈[+1261,+1461]mm · 12 of 46 slices shown, 14 images]
[im 3/46  soft-tissue]
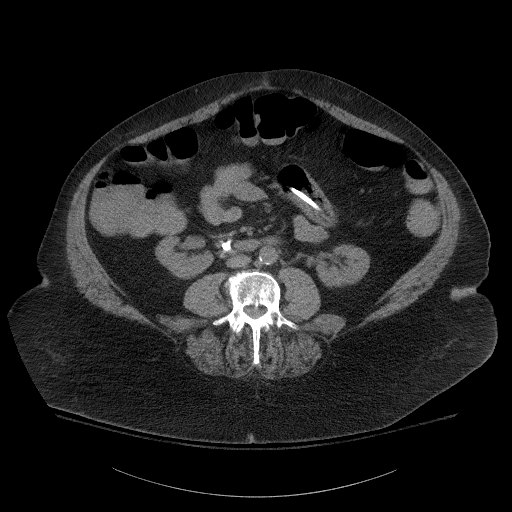
[im 3/46  bone]
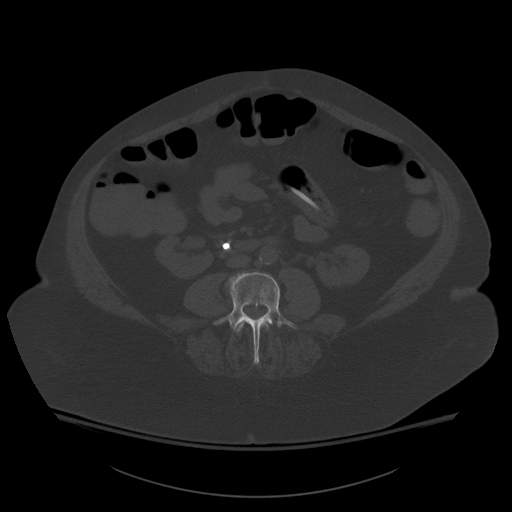
[im 8/46  soft-tissue]
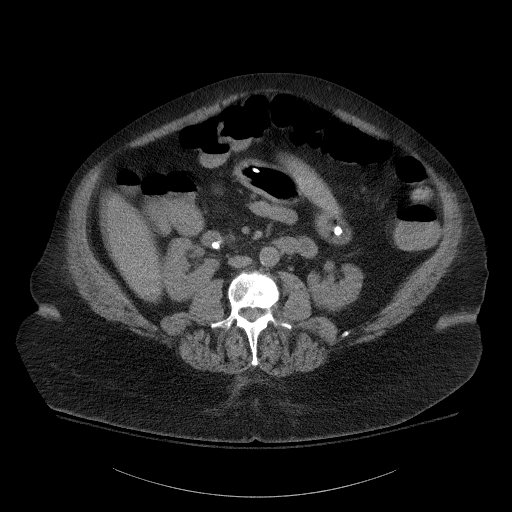
[im 10/46  soft-tissue]
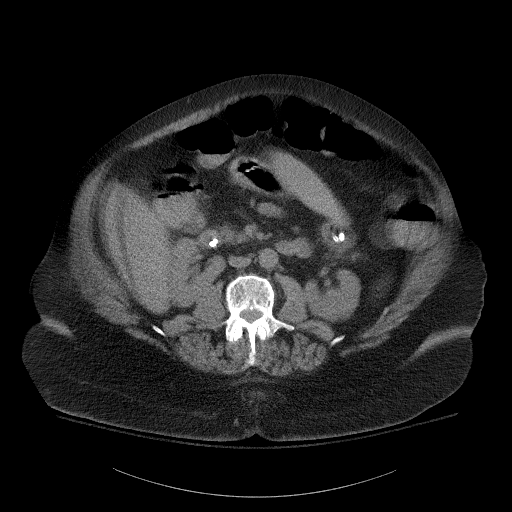
[im 15/46  soft-tissue]
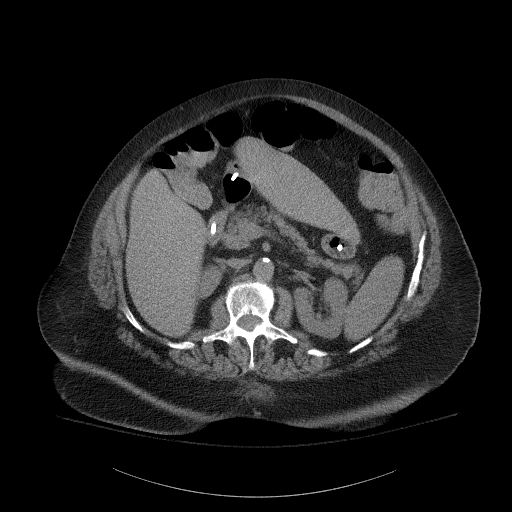
[im 17/46  soft-tissue]
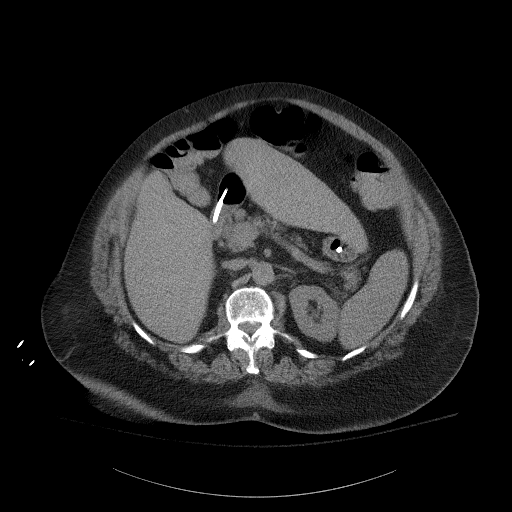
[im 22/46  soft-tissue]
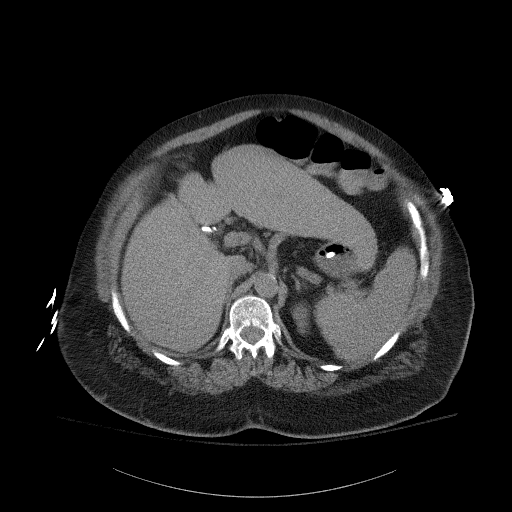
[im 24/46  soft-tissue]
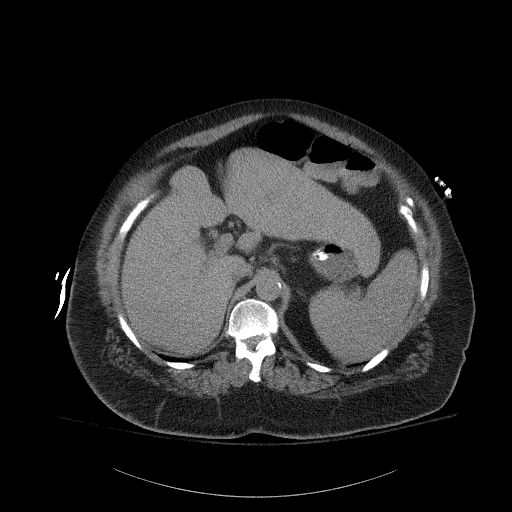
[im 29/46  soft-tissue]
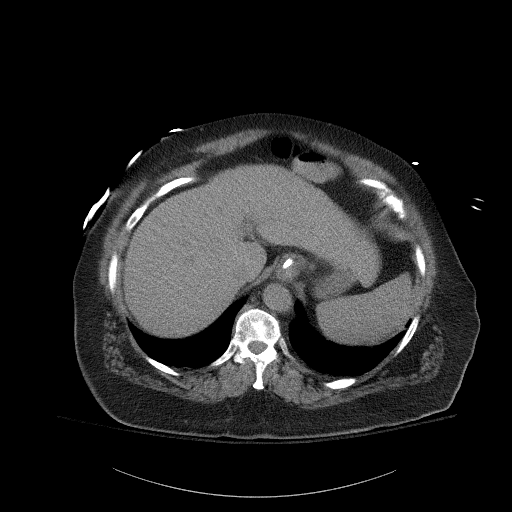
[im 31/46  soft-tissue]
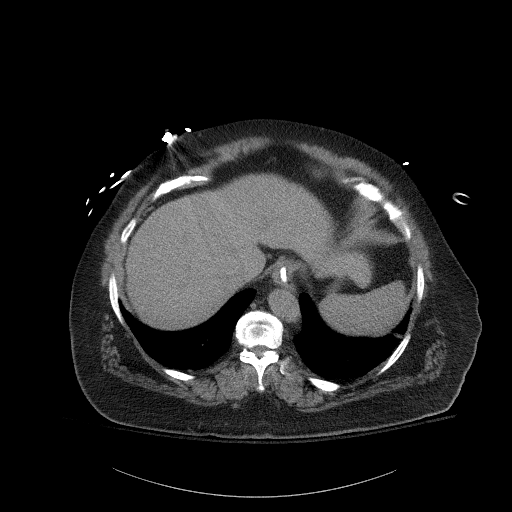
[im 31/46  bone]
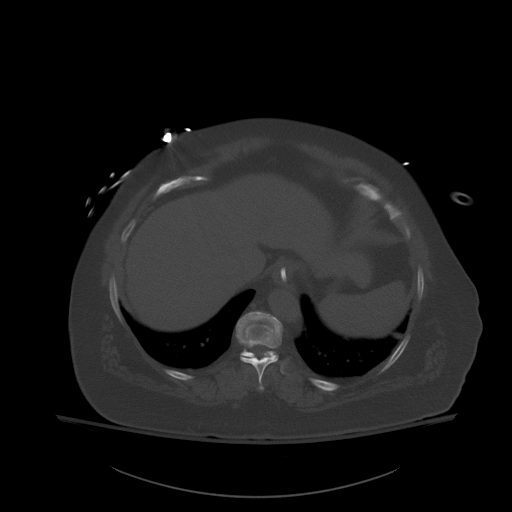
[im 36/46  soft-tissue]
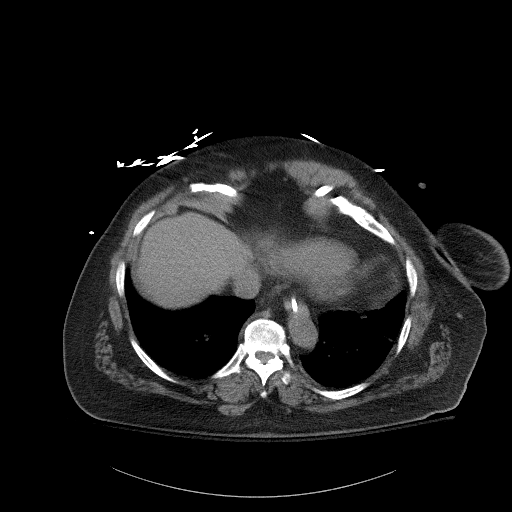
[im 38/46  soft-tissue]
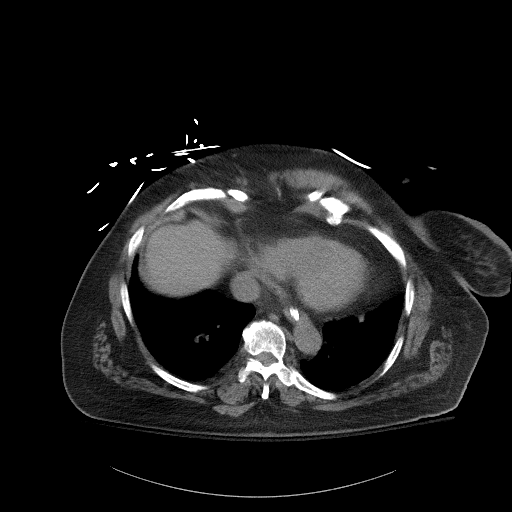
[im 43/46  soft-tissue]
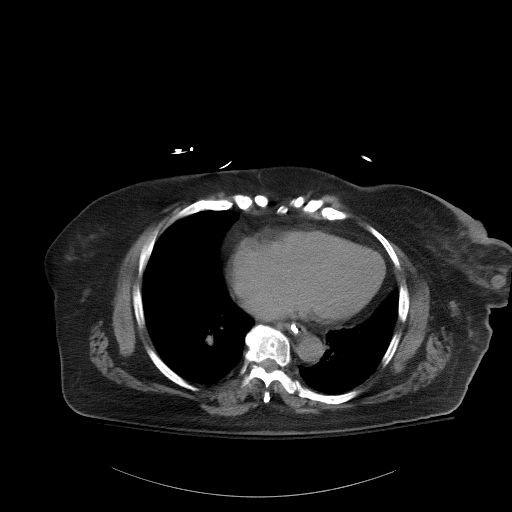

[Series 6: a/p w/o cor · coronal · non-contrast · 0.45mm/px · 3 of 170 slices shown]
[im 57/170  soft-tissue]
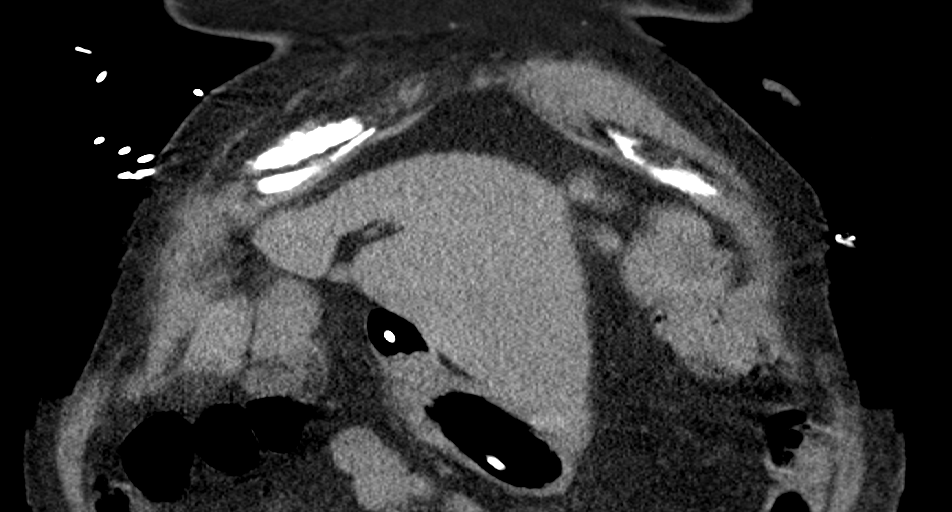
[im 76/170  soft-tissue]
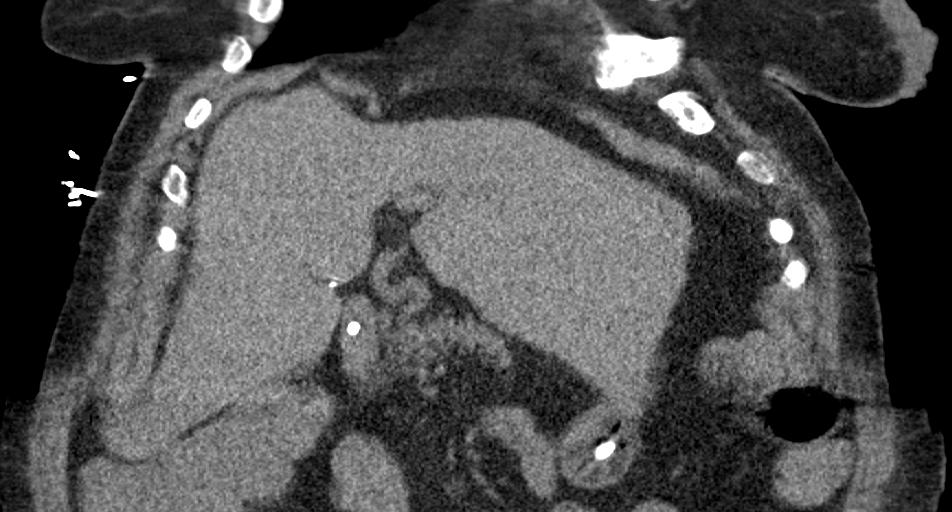
[im 94/170  soft-tissue]
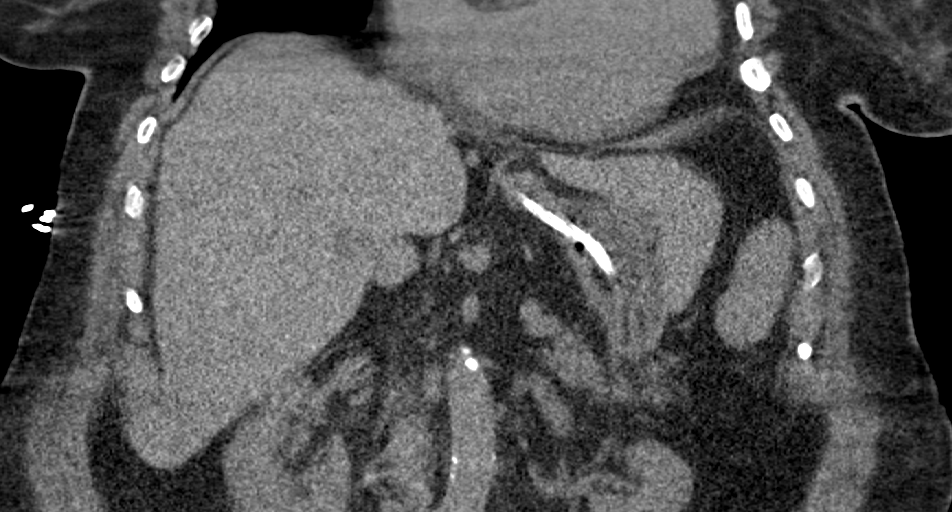

[15 of 46 positions shown; findings below may reference images not displayed]

FINDINGS: Lower chest: No pleural or pericardial effusion. Linear scarring or
subsegmental atelectasis in the lung bases.

Hepatobiliary: No focal liver abnormality is seen. Status post
cholecystectomy. No biliary dilatation.

Pancreas: Mild diffuse atrophy without mass or ductal dilatation.

Spleen: Normal in size without focal abnormality.

Adrenals/Urinary Tract: Normal adrenals.

No hydronephrosis. Lower poles of the kidneys incompletely
visualized.

Stomach/Bowel: Stomach is decompressed. There is no direct
percutaneous window due to overlying transverse colon and liver.
Feeding tube extends to the mid duodenum. If if visualized portions
of small bowel are decompressed. There is mild gaseous distention of
the visualized colon.

Vascular/Lymphatic: Calcified aortic plaque without aneurysm. No
abdominal or mesenteric adenopathy identified.

Other: No ascites.  No free air.

Musculoskeletal: Multilevel lumbar spondylitic change. No fracture
or worrisome bone lesion.
IMPRESSION: 1. There is no direct percutaneous approach to the decompressed
stomach. Colonic opacification would be required for evaluation of
potential of safe percutaneous window with gastric distension.
Alternatively, consider surgical gastrostomy placement.
